# Patient Record
Sex: Female | Born: 1965 | Race: White | Hispanic: No | Marital: Married | Smoking: Former smoker
Health system: Southern US, Community
[De-identification: ages and names within clinical notes are randomized; demographics above are authoritative.]

## PROBLEM LIST (undated history)

## (undated) DIAGNOSIS — I10 Essential (primary) hypertension: Secondary | ICD-10-CM

## (undated) DIAGNOSIS — I219 Acute myocardial infarction, unspecified: Secondary | ICD-10-CM

## (undated) HISTORY — PX: CORONARY STENT PLACEMENT: SHX6853

---

## 2020-03-21 ENCOUNTER — Other Ambulatory Visit: Payer: Self-pay

## 2020-03-21 ENCOUNTER — Emergency Department (INDEPENDENT_AMBULATORY_CARE_PROVIDER_SITE_OTHER): Payer: 59

## 2020-03-21 ENCOUNTER — Emergency Department
Admission: EM | Admit: 2020-03-21 | Discharge: 2020-03-21 | Disposition: A | Discharge status: Home / Self Care | Payer: 59 | Source: Home / Self Care

## 2020-03-21 DIAGNOSIS — S92355A Nondisplaced fracture of fifth metatarsal bone, left foot, initial encounter for closed fracture: Secondary | ICD-10-CM

## 2020-03-21 HISTORY — DX: Essential (primary) hypertension: I10

## 2020-03-21 HISTORY — DX: Acute myocardial infarction, unspecified: I21.9

## 2020-03-21 NOTE — Discharge Instructions (Signed)
  Please use the boot and do not put weight on your Left foot until you follow up with Sports Medicine later this week or early next week for further evaluation and ongoing treatment of your broken foot. You may take the boot off to bath and apply a cool compress 2-3 times daily the first few days to help with swelling.  You may take Tylenol and Motrin as needed for pain and swelling.

## 2020-03-21 NOTE — ED Triage Notes (Signed)
Patient presents to Urgent Care with complaints of left foot pain since twisting her foot yesterday while walking in heels. Patient reports she heard a pop and now there is discoloration on the lateral aspect of her left foot, swelling noted as well. Pt unable to bear weight.

## 2020-03-21 NOTE — ED Provider Notes (Signed)
[facility]    CSN: [id number] Arrival date & time: 03/21/20  1017      History   Chief Complaint Chief Complaint  Patient presents with  . Foot Injury    HPI Caroline Brown is a 54 y.o. female.   HPI Caroline Brown is a 54 y.o. female presenting to UC with c/o sudden onset Left lateral foot pain yesterday. Pain is throbbing, 7/10, unable to bear weight on her foot. Pt twisted her foot wearing heels while walking on a flat surface yesterday. She heard a loud pop just prior to onset of pain.  She has noticed bruising nad swelling to the side of her foot where the pain is.  She has been using a walker she had at home from a prior owner. She does not use a walker normally.  No prior fracture or surgery to same foot. No other injuries.    Past Medical History:  Diagnosis Date  . Hypertension   . Myocardial infarction (HCC)    Six MIs per pt    There are no problems to display for this patient.   Past Surgical History:  Procedure Laterality Date  . CORONARY STENT PLACEMENT      OB History   No obstetric history on file.      Home Medications    Prior to Admission medications   Medication Sig Start Date End Date Taking? Authorizing Provider  ezetimibe (ZETIA) 10 MG tablet Take 10 mg by mouth daily.   Yes [provider]  losartan (COZAAR) 50 MG tablet Take 50 mg by mouth daily.   Yes [provider]  metoprolol tartrate (LOPRESSOR) 25 MG tablet Take 25 mg by mouth 2 (two) times daily.   Yes [provider]  omeprazole (PRILOSEC) 20 MG capsule Take 20 mg by mouth daily.   Yes [provider]    Family History Family History  Problem Relation Age of Onset  . Hypertension Mother   . Hypertension Father   . Cancer Father     Social History Social History   Tobacco Use  . Smoking status: Former Smoker    Types: Cigarettes    Quit date: 2006    Years since quitting: 15.3  . Smokeless tobacco: Never Used    Substance Use Topics  . Alcohol use: Not Currently  . Drug use: Not on file     Allergies   Codeine   Review of Systems Review of Systems  Musculoskeletal: Positive for arthralgias, gait problem and joint swelling.  Skin: Positive for color change. Negative for wound.  Neurological: Negative for weakness and numbness.     Physical Exam Triage Vital Signs ED Triage Vitals  Enc Vitals Group     BP 03/21/20 1036 116/65     Pulse Rate 03/21/20 1036 84     Resp 03/21/20 1036 16     Temp 03/21/20 1036 98.6 F (37 C)     Temp Source 03/21/20 1036 Oral     SpO2 03/21/20 1036 99 %     Weight --      Height --      Head Circumference --      Peak Flow --      Pain Score 03/21/20 1031 7     Pain Loc --      Pain Edu? --      Excl. in GC? --    No data found.  Updated Vital Signs BP 116/65 (BP Location: Right Arm)  Pulse 84   Temp 98.6 F (37 C) (Oral)   Resp 16   SpO2 99%   Visual Acuity Right Eye Distance:   Left Eye Distance:   Bilateral Distance:    Right Eye Near:   Left Eye Near:    Bilateral Near:     Physical Exam Vitals and nursing note reviewed.  Constitutional:      Appearance: Normal appearance. She is well-developed.  HENT:     Head: Normocephalic and atraumatic.  Cardiovascular:     Rate and Rhythm: Normal rate and regular rhythm.     Pulses:          Dorsalis pedis pulses are 2+ on the left side.       Posterior tibial pulses are 2+ on the left side.  Pulmonary:     Effort: Pulmonary effort is normal.  Musculoskeletal:        General: Swelling and tenderness present. Normal range of motion.     Cervical back: Normal range of motion.     Comments: Left foot: moderate edema compared to Right foot. Tenderness along 5th metatarsal. Full ROM ankle and toes.   Skin:    General: Skin is warm and dry.     Findings: Bruising present.     Comments: Left foot: mild ecchymosis to lateral aspect. Skin in tact  Neurological:     Mental Status:  She is alert and oriented to person, place, and time.     Sensory: No sensory deficit.  Psychiatric:        Behavior: Behavior normal.      UC Treatments / Results  Labs (all labs ordered are listed, but only abnormal results are displayed) Labs Reviewed - No data to display  EKG   Radiology DG Foot Complete Left  Result Date: 03/21/2020 CLINICAL DATA:  Foot injury with pain. Pain in the fifth metatarsal region. EXAM: LEFT FOOT - COMPLETE 3+ VIEW COMPARISON:  No comparison studies available. FINDINGS: Transverse nondisplaced fracture identified at the base of the fifth metatarsal. No other evidence for an acute fracture. No subluxation or dislocation. No worrisome lytic or sclerotic osseous abnormality. IMPRESSION: Nondisplaced transverse fracture at the base of the fifth metatarsal. Electronically Signed   By: Misty Stanley M.D.   On: 03/21/2020 10:56    Procedures Procedures (including critical care time)  Medications Ordered in UC Medications - No data to display  Initial Impression / Assessment and Plan / UC Course  I have reviewed the triage vital signs and the nursing notes.  Pertinent labs & imaging results that were available during my care of the patient were reviewed by me and considered in my medical decision making (see chart for details).     Imaging discussed with pt Pt placed in walking boot, may continue to use her walker as it has a seat area she can put her knee to use as a knee scooter.  F/u with Sports Medicine later this week or early next week AVS provided Final Clinical Impressions(s) / UC Diagnoses   Final diagnoses:  Closed nondisplaced fracture of fifth metatarsal bone of left foot, initial encounter     Discharge Instructions      Please use the boot and do not put weight on your Left foot until you follow up with Sports Medicine later this week or early next week for further evaluation and ongoing treatment of your broken foot. You may  take the boot off to bath and apply a cool compress  2-3 times daily the first few days to help with swelling.  You may take Tylenol and Motrin as needed for pain and swelling.    ED Prescriptions    None     PDMP not reviewed this encounter.   Lurene Shadow, [place] 03/21/20 1314

## 2020-03-25 ENCOUNTER — Ambulatory Visit: Payer: 59 | Admitting: Sports Medicine

## 2020-03-25 ENCOUNTER — Other Ambulatory Visit: Payer: Self-pay

## 2020-03-25 ENCOUNTER — Encounter: Payer: Self-pay | Admitting: Sports Medicine

## 2020-03-25 DIAGNOSIS — S92352A Displaced fracture of fifth metatarsal bone, left foot, initial encounter for closed fracture: Secondary | ICD-10-CM | POA: Diagnosis not present

## 2020-03-25 NOTE — Progress Notes (Signed)
    Procedures performed today:    None.  Independent interpretation of notes and tests performed by another provider:   X-rays personally reviewed and show a transverse fracture through the base of the fifth metatarsal, looks to be in zone 1 but bordering zone 2.  Brief History, Exam, Impression, and Recommendations:    Closed fracture of base of fifth metatarsal bone of left foot Caroline Brown is a very pleasant 54 year old female, she does have a complex medical history including several hypercoagulable states including factor V Leyden deficiency, lupus anticoagulant. More recently she took a misstep and felt a pop in her left foot, immediately she had pain and swelling, inability to bear weight, she was seen in urgent care where x-rays showed a fracture in zone one of the fifth metatarsal, but close to the zone 2 territory. I explained to her the difference between the 2 types of fractures, and the treatment plan which will be strict nonweightbearing, rolling knee scooter. I had like to see her back in 2 weeks for repeat x-rays, if less tenderness and signs of healing on the x-ray we will consider partial weightbearing with a single crutch. She declines any pain medication.    ___________________________________________ Ihor Austin. Benjamin Stain, M.D., ABFM., CAQSM. Primary Care and Sports Medicine [facility]  Adjunct Instructor of Family Medicine  [employer]

## 2020-03-25 NOTE — Assessment & Plan Note (Signed)
Caroline Brown is a very pleasant 54 year old female, she does have a complex medical history including several hypercoagulable states including factor V Leyden deficiency, lupus anticoagulant. More recently she took a misstep and felt a pop in her left foot, immediately she had pain and swelling, inability to bear weight, she was seen in urgent care where x-rays showed a fracture in zone one of the fifth metatarsal, but close to the zone 2 territory. I explained to her the difference between the 2 types of fractures, and the treatment plan which will be strict nonweightbearing, rolling knee scooter. I had like to see her back in 2 weeks for repeat x-rays, if less tenderness and signs of healing on the x-ray we will consider partial weightbearing with a single crutch. She declines any pain medication.

## 2020-04-08 ENCOUNTER — Encounter: Payer: Self-pay | Admitting: Sports Medicine

## 2020-04-08 ENCOUNTER — Other Ambulatory Visit: Payer: Self-pay

## 2020-04-08 ENCOUNTER — Ambulatory Visit (INDEPENDENT_AMBULATORY_CARE_PROVIDER_SITE_OTHER): Payer: 59 | Admitting: Sports Medicine

## 2020-04-08 ENCOUNTER — Ambulatory Visit (INDEPENDENT_AMBULATORY_CARE_PROVIDER_SITE_OTHER): Payer: 59

## 2020-04-08 DIAGNOSIS — S92352A Displaced fracture of fifth metatarsal bone, left foot, initial encounter for closed fracture: Secondary | ICD-10-CM

## 2020-04-08 NOTE — Assessment & Plan Note (Signed)
Caroline Brown returns, she is a pleasant 54 year old female, 2 weeks ago she had an inversion injury, she ended up with a fracture at the border of the first and second zones of the fifth metatarsal. She returns today, clinically she is a little better, still has significant pain on palpation. X-rays personally reviewed and do show some bony resorption around the fracture but no angulation or displacement. Continue boot with partial weightbearing for at least another 4 weeks, this will take her to 6 weeks out, x-ray before visit.

## 2020-04-08 NOTE — Addendum Note (Signed)
Addended by: Monica Becton on: 04/08/2020 03:02 PM   Modules accepted: Orders

## 2020-04-08 NOTE — Progress Notes (Signed)
    Procedures performed today:    None.  Independent interpretation of notes and tests performed by another provider:   X-rays personally reviewed and do show some bony resorption around the fracture but no angulation or displacement.  Brief History, Exam, Impression, and Recommendations:    Closed fracture of base of fifth metatarsal bone of left foot Caroline Brown returns, she is a pleasant 54 year old female, 2 weeks ago she had an inversion injury, she ended up with a fracture at the border of the first and second zones of the fifth metatarsal. She returns today, clinically she is a little better, still has significant pain on palpation. X-rays personally reviewed and do show some bony resorption around the fracture but no angulation or displacement. Continue boot with partial weightbearing for at least another 4 weeks, this will take her to 6 weeks out, x-ray before visit.    ___________________________________________ Caroline Brown. Caroline Brown, M.D., ABFM., CAQSM. Primary Care and Sports Medicine [facility]  Adjunct Instructor of Family Medicine  [employer]

## 2020-05-06 ENCOUNTER — Other Ambulatory Visit: Payer: Self-pay

## 2020-05-06 ENCOUNTER — Ambulatory Visit: Payer: 59 | Admitting: Sports Medicine

## 2020-05-06 ENCOUNTER — Ambulatory Visit (INDEPENDENT_AMBULATORY_CARE_PROVIDER_SITE_OTHER): Payer: 59

## 2020-05-06 DIAGNOSIS — S92352A Displaced fracture of fifth metatarsal bone, left foot, initial encounter for closed fracture: Secondary | ICD-10-CM | POA: Diagnosis not present

## 2020-05-06 NOTE — Assessment & Plan Note (Signed)
This is a very pleasant 54 year old female with a fifth metatarsal fracture, 4 weeks out, fracture is at the border of zone 1 and zone 2. Clinically she is doing very well, no tenderness over the fracture, she does have a little bit of filling in of the fracture line itself, I really do think this does represent a Jones fracture, we will keep her in the boot for 1 more month, get rid of the scooter, weightbearing as tolerated, x-ray before visit.

## 2020-05-06 NOTE — Progress Notes (Signed)
    Procedures performed today:    None.  Independent interpretation of notes and tests performed by another provider:   X-rays personally reviewed, there is some filling in of the fracture line.  Brief History, Exam, Impression, and Recommendations:    Closed fracture of base of fifth metatarsal bone of left foot This is a very pleasant 54 year old female with a fifth metatarsal fracture, 4 weeks out, fracture is at the border of zone 1 and zone 2. Clinically she is doing very well, no tenderness over the fracture, she does have a little bit of filling in of the fracture line itself, I really do think this does represent a Jones fracture, we will keep her in the boot for 1 more month, get rid of the scooter, weightbearing as tolerated, x-ray before visit.    ___________________________________________ Ihor Austin. Benjamin Stain, M.D., ABFM., CAQSM. Primary Care and Sports Medicine [facility]  Adjunct Instructor of Family Medicine  [employer]

## 2020-06-03 ENCOUNTER — Other Ambulatory Visit: Payer: Self-pay

## 2020-06-03 ENCOUNTER — Ambulatory Visit (INDEPENDENT_AMBULATORY_CARE_PROVIDER_SITE_OTHER): Payer: 59

## 2020-06-03 ENCOUNTER — Ambulatory Visit: Payer: 59 | Admitting: Sports Medicine

## 2020-06-03 DIAGNOSIS — S92352A Displaced fracture of fifth metatarsal bone, left foot, initial encounter for closed fracture: Secondary | ICD-10-CM

## 2020-06-03 NOTE — Assessment & Plan Note (Signed)
This is a very pleasant 54 year old female, approximately 2 months ago she sustained 1/5 metatarsal fracture, this has taken some time to heal, I really think this was a Jones fracture, today she is completely pain-free, she is eager to get rid of the boot. I think she is healed, return as needed, she should wear supportive shoes.

## 2020-06-03 NOTE — Progress Notes (Signed)
    Procedures performed today:    None.  Independent interpretation of notes and tests performed by another provider:   X-rays personally reviewed, there is good filling in of the medial aspect of the fracture line.  Brief History, Exam, Impression, and Recommendations:    Closed fracture of base of fifth metatarsal bone of left foot This is a very pleasant 54 year old female, approximately 2 months ago she sustained 1/5 metatarsal fracture, this has taken some time to heal, I really think this was a Jones fracture, today she is completely pain-free, she is eager to get rid of the boot. I think she is healed, return as needed, she should wear supportive shoes.    ___________________________________________ Ihor Austin. Benjamin Stain, M.D., ABFM., CAQSM. Primary Care and Sports Medicine [facility]  Adjunct Instructor of Family Medicine  [employer]

## 2021-05-18 ENCOUNTER — Ambulatory Visit (INDEPENDENT_AMBULATORY_CARE_PROVIDER_SITE_OTHER): Payer: No Typology Code available for payment source

## 2021-05-18 ENCOUNTER — Ambulatory Visit: Payer: No Typology Code available for payment source | Admitting: Sports Medicine

## 2021-05-18 ENCOUNTER — Other Ambulatory Visit: Payer: Self-pay

## 2021-05-18 DIAGNOSIS — M76822 Posterior tibial tendinitis, left leg: Secondary | ICD-10-CM | POA: Diagnosis not present

## 2021-05-18 DIAGNOSIS — S92352A Displaced fracture of fifth metatarsal bone, left foot, initial encounter for closed fracture: Secondary | ICD-10-CM

## 2021-05-18 MED ORDER — MELOXICAM 15 MG PO TABS: ORAL_TABLET | ORAL | 3 refills | Status: DC

## 2021-05-18 NOTE — Progress Notes (Signed)
    Procedures performed today:    None.  Independent interpretation of notes and tests performed by another provider:   None.  Brief History, Exam, Impression, and Recommendations:    Tibialis posterior tendinitis, left Caroline Brown is a pleasant 55 year old female, for several weeks she has had pain behind her medial malleolus of the left foot, radiating from the tibia down to the navicular. On exam she has swelling at the tibialis posterior, reproduction of pain with palpation on the tibialis posterior, navicular is nontender, she has pes cavus. I like her to get some custom orthotics, wear athletic shoes, adding meloxicam, x-rays, formal physical therapy for the tibialis posterior, return to see me in a month, injection if no better.  Closed fracture of base of fifth metatarsal bone of left foot We treated Caroline Brown for a fifth metatarsal base fracture, she is overall doing okay, I would like some updated x-rays as she did complain of a bit of tenderness to palpation.    ___________________________________________ Ihor Austin. Benjamin Stain, M.D., ABFM., CAQSM. Primary Care and Sports Medicine [facility]  Adjunct Instructor of Family Medicine  [employer]

## 2021-05-18 NOTE — Assessment & Plan Note (Signed)
We treated Caroline Brown for a fifth metatarsal base fracture, she is overall doing okay, I would like some updated x-rays as she did complain of a bit of tenderness to palpation.

## 2021-05-18 NOTE — Assessment & Plan Note (Signed)
Canyon is a pleasant 55 year old female, for several weeks she has had pain behind her medial malleolus of the left foot, radiating from the tibia down to the navicular. On exam she has swelling at the tibialis posterior, reproduction of pain with palpation on the tibialis posterior, navicular is nontender, she has pes cavus. I like her to get some custom orthotics, wear athletic shoes, adding meloxicam, x-rays, formal physical therapy for the tibialis posterior, return to see me in a month, injection if no better.

## 2021-05-24 ENCOUNTER — Other Ambulatory Visit: Payer: Self-pay

## 2021-05-24 ENCOUNTER — Encounter: Payer: Self-pay | Admitting: Physical Therapy

## 2021-05-24 ENCOUNTER — Ambulatory Visit: Payer: No Typology Code available for payment source | Admitting: Physical Therapy

## 2021-05-24 DIAGNOSIS — M25572 Pain in left ankle and joints of left foot: Secondary | ICD-10-CM | POA: Diagnosis not present

## 2021-05-24 DIAGNOSIS — R29898 Other symptoms and signs involving the musculoskeletal system: Secondary | ICD-10-CM

## 2021-05-24 NOTE — Patient Instructions (Signed)
Access Code: [email or web address] URL: [email or web address] Date: 05/24/2021 Prepared by: Reggy Eye  Exercises Gastroc Stretch on Wall - 1 x daily - 7 x weekly - 3 sets - 1 reps - 30 sec hold Soleus Stretch on Wall - 1 x daily - 7 x weekly - 3 sets - 1 reps - 30 sec hold

## 2021-05-24 NOTE — Therapy (Addendum)
[facility] [place], [place], [address] Phone: [phone]   Fax:  [phone]  Physical Therapy Evaluation  Patient Details  Name: Caroline Brown MRN: [id number] Date of Birth: 03-Sep-1966 Referring Provider (PT): thekkekandam   Encounter Date: 05/24/2021   PT End of Session - 05/24/21 1235     Visit Number 1    Number of Visits 6    Date for PT Re-Evaluation 07/05/21    PT Start Time 8016    PT Stop Time 1227    PT Time Calculation (min) 39 min    Activity Tolerance Patient tolerated treatment well    Behavior During Therapy [facility] for tasks assessed/performed             Past Medical History:  Diagnosis Date   Hypertension    Myocardial infarction ([place])    Six MIs per pt    Past Surgical History:  Procedure Laterality Date   CORONARY STENT PLACEMENT      There were no vitals filed for this visit.    Subjective Assessment - 05/24/21 1149     Subjective Pt has been having Lt ankle pain shooting to mid foot since 03/2020 when she had a broken 5th metatarsal. MD recommended pain meds and physical therapy as well as a brace which has not yet been ordered. Pain does not change based on activity or rest, not relieved with meds thus far.    Pertinent History Lt 5th metatarsal fracture 2021    Diagnostic tests x ray shows 5th metatarsal fracture has healed    Patient Stated Goals reduce pain    Currently in Pain? Yes    Pain Score 2     Pain Location Foot    Pain Orientation Left    Pain Descriptors / Indicators Aching    Pain Type Chronic pain    Pain Onset More than a month ago    Pain Frequency Constant    Aggravating Factors  none reported    Pain Relieving Factors none reported                [facility] PT Assessment - 05/24/21 0001       Assessment   Medical Diagnosis left tibialis posterior tendonitis    Referring Provider (PT) [email or web address]      Restrictions   Other  Position/Activity Restrictions WBAT      Balance Screen   Has the patient fallen in the past 6 months No      Prior Function   Level of Independence Independent      Observation/Other Assessments   Focus on Therapeutic Outcomes (FOTO)  72      Functional Tests   Functional tests Single leg stance      Single Leg Stance   Comments >30 sec bilat      ROM / Strength   AROM / PROM / Strength AROM;Strength      AROM   AROM Assessment Site Ankle    Right/Left Ankle Right;Left    Right Ankle Dorsiflexion 12    Right Ankle Plantar Flexion 40    Right Ankle Inversion 30    Right Ankle Eversion 15    Left Ankle Dorsiflexion 14    Left Ankle Plantar Flexion 42    Left Ankle Inversion 15    Left Ankle Eversion 22      Strength   Strength Assessment Site Ankle    Right/Left Ankle Left    Left Ankle Dorsiflexion  5/5    Left Ankle Plantar Flexion 5/5    Left Ankle Inversion 5/5    Left Ankle Eversion 5/5      Palpation   Palpation comment TTP Lt soleus, achiles, midfoot, posterior tib                        Objective measurements completed on examination: See above findings.       [facility] PT Treatment/Exercise - 05/24/21 0001       Exercises   Exercises Ankle      Modalities   Modalities Iontophoresis      Iontophoresis   Type of Iontophoresis Dexamethasone    Location Lt ankle    Dose 1.0cc    Time 4 hours      Ankle Exercises: Stretches   Soleus Stretch 2 reps;20 seconds    Gastroc Stretch 2 reps;20 seconds                    PT Education - 05/24/21 1234     Education Details significant time spent educating pt on importance of proper footwear to reduce pain. PT POC and goals, HEP    Person(s) Educated Patient    Methods Explanation;Demonstration    Comprehension Verbalized understanding;Returned demonstration                 PT Long Term Goals - 05/24/21 1239       PT LONG TERM GOAL #1   Title Pt will be  independent with HEP    Time 6    Period Weeks    Status New    Target Date 07/05/21      PT LONG TERM GOAL #2   Title Pt will report pain <3/10 on average for 5/7 days.    Time 6    Period Weeks    Status New    Target Date 07/05/21      PT LONG TERM GOAL #3   Title Pt will improve FOTO to >= 75 to demo improved functional mobility    Time 6    Period Weeks    Status New    Target Date 07/05/21                    Plan - 05/24/21 1235     Clinical Impression Statement Pt is a 55 y/o female who is referred for Lt foot and ankle pain. Pt presents with decreased ankle ROM, decreased activity tolerance and increased pain and will benefit from skilled PT to address deficits and improve functional mobility.    Personal Factors and Comorbidities Behavior Pattern;Fitness;Comorbidity 3+    Examination-Participation Restrictions Community Activity    Stability/Clinical Decision Making Stable/Uncomplicated    Clinical Decision Making Low    Rehab Potential Good    PT Frequency 1x / week    PT Duration 6 weeks    PT Treatment/Interventions Iontophoresis 29m/ml Dexamethasone;Cryotherapy;Electrical Stimulation;Moist Heat;Ultrasound;Taping;Dry needling;Manual techniques;Patient/family education;Therapeutic exercise;Balance training;Neuromuscular re-education;Therapeutic activities;Vasopneumatic Device    PT Next Visit Plan assess response to ionto, progress HEP    PT Home Exercise Plan [id number]            Patient will benefit from skilled therapeutic intervention in order to improve the following deficits and impairments:  Pain, Decreased activity tolerance, Decreased range of motion  Visit Diagnosis: Pain in left ankle and joints of left foot - Plan: PT plan of care cert/re-cert  Other symptoms and signs  involving the musculoskeletal system - Plan: PT plan of care cert/re-cert     Problem List Patient Active Problem List   Diagnosis Date Noted   Tibialis posterior  tendinitis, left 05/18/2021   Closed fracture of base of fifth metatarsal bone of left foot 03/25/2020  PHYSICAL THERAPY DISCHARGE SUMMARY  Visits from Start of Care: 1  Current functional level related to goals / functional outcomes: No progress due to only attending evaluation   Remaining deficits: See above   Education / Equipment: HEP   Patient agrees to discharge. Patient goals were not met. Patient is being discharged due to not returning since the last visit. Isabelle Course, PT,DPT08/12/223:04 PM   Isabelle Course, [place] 05/24/2021, 12:42 PM  [facility] [address] [place], [place], [address] Phone: [phone]   Fax:  [phone]  Name: Caroline Brown MRN: [id number] Date of Birth: 1966-06-26

## 2021-06-19 ENCOUNTER — Ambulatory Visit: Payer: No Typology Code available for payment source | Admitting: Sports Medicine

## 2021-06-30 ENCOUNTER — Other Ambulatory Visit: Payer: Self-pay | Admitting: *Deleted

## 2021-06-30 DIAGNOSIS — M76822 Posterior tibial tendinitis, left leg: Secondary | ICD-10-CM

## 2021-06-30 MED ORDER — MELOXICAM 15 MG PO TABS: 15.0000 mg | ORAL_TABLET | Freq: Every day | ORAL | 3 refills | Status: DC

## 2021-07-10 ENCOUNTER — Encounter: Payer: Self-pay | Admitting: Family Medicine

## 2021-07-10 ENCOUNTER — Other Ambulatory Visit: Payer: Self-pay

## 2021-07-10 ENCOUNTER — Ambulatory Visit: Payer: No Typology Code available for payment source | Admitting: Family Medicine

## 2021-07-10 DIAGNOSIS — M76822 Posterior tibial tendinitis, left leg: Secondary | ICD-10-CM | POA: Diagnosis not present

## 2021-07-10 NOTE — Assessment & Plan Note (Signed)
Having some instability leading to her recurrent pain. -Counseled on home exercise therapy and supportive care. -Orthotics.

## 2021-07-10 NOTE — Progress Notes (Signed)
  Caroline Brown - 55 y.o. female MRN [id number]  Date of birth: 03-20-1966  SUBJECTIVE:  Including CC & ROS.  No chief complaint on file.   Caroline Brown is a 55 y.o. female that is presenting with left foot pain.  Having some posterior tibialis tendinitis  causing her pain.   Review of Systems See HPI   HISTORY: Past Medical, Surgical, Social, and Family History Reviewed & Updated per EMR.   Pertinent Historical Findings include:  Past Medical History:  Diagnosis Date   Hypertension    Myocardial infarction (HCC)    Six MIs per pt    Past Surgical History:  Procedure Laterality Date   CORONARY STENT PLACEMENT      Family History  Problem Relation Age of Onset   Hypertension Mother    Hypertension Father    Cancer Father     Social History   Socioeconomic History   Marital status: Married    Spouse name: Not on file   Number of children: Not on file   Years of education: Not on file   Highest education level: Not on file  Occupational History   Not on file  Tobacco Use   Smoking status: Former    Types: Cigarettes    Quit date: 2006    Years since quitting: 16.6   Smokeless tobacco: Never  Substance and Sexual Activity   Alcohol use: Not Currently   Drug use: Not on file   Sexual activity: Not on file  Other Topics Concern   Not on file  Social History Narrative   Not on file   Social Determinants of Health   Financial Resource Strain: Not on file  Food Insecurity: Not on file  Transportation Needs: Not on file  Physical Activity: Not on file  Stress: Not on file  Social Connections: Not on file  Intimate Partner Violence: Not on file     PHYSICAL EXAM:  VS: Ht 5\' 2"  (1.575 m)   Wt 162 lb (73.5 kg)   BMI 29.63 kg/m  Physical Exam Gen: NAD, alert, cooperative with exam, well-appearing   Patient was fitted for a standard, cushioned, semi-rigid orthotic. The orthotic was heated and afterward the patient stood on the orthotic blank positioned  on the orthotic stand. The patient was positioned in subtalar neutral position and 10 degrees of ankle dorsiflexion in a weight bearing stance. After completion of molding, a stable base was applied to the orthotic blank. The blank was ground to a stable position for weight bearing. Size: 81m Pairs: 2 Base: Blue EVA Additional Posting and Padding: None The patient ambulated these, and they were very comfortable.   ASSESSMENT & PLAN:   Tibialis posterior tendinitis, left Having some instability leading to her recurrent pain. -Counseled on home exercise therapy and supportive care. -Orthotics.

## 2021-08-08 ENCOUNTER — Other Ambulatory Visit: Payer: Self-pay | Admitting: *Deleted

## 2021-08-08 DIAGNOSIS — I728 Aneurysm of other specified arteries: Secondary | ICD-10-CM

## 2021-08-08 DIAGNOSIS — R1013 Epigastric pain: Secondary | ICD-10-CM

## 2021-08-11 ENCOUNTER — Other Ambulatory Visit: Payer: Self-pay

## 2021-08-11 ENCOUNTER — Ambulatory Visit
Admission: RE | Admit: 2021-08-11 | Discharge: 2021-08-11 | Disposition: A | Discharge status: Home / Self Care | Payer: No Typology Code available for payment source | Source: Ambulatory Visit | Attending: *Deleted | Admitting: *Deleted

## 2021-08-11 DIAGNOSIS — R1013 Epigastric pain: Secondary | ICD-10-CM

## 2021-08-11 DIAGNOSIS — I728 Aneurysm of other specified arteries: Secondary | ICD-10-CM

## 2021-08-11 MED ORDER — IOPAMIDOL (ISOVUE-370) INJECTION 76%
75.0000 mL | Freq: Once | INTRAVENOUS | Status: AC | PRN
  Administered 2021-08-11: 75 mL via INTRAVENOUS

## 2021-08-23 ENCOUNTER — Other Ambulatory Visit: Payer: No Typology Code available for payment source

## 2021-11-06 ENCOUNTER — Ambulatory Visit: Payer: No Typology Code available for payment source | Admitting: Sports Medicine

## 2021-11-06 ENCOUNTER — Other Ambulatory Visit: Payer: Self-pay

## 2021-11-06 ENCOUNTER — Ambulatory Visit (INDEPENDENT_AMBULATORY_CARE_PROVIDER_SITE_OTHER): Payer: No Typology Code available for payment source

## 2021-11-06 DIAGNOSIS — M79671 Pain in right foot: Secondary | ICD-10-CM | POA: Diagnosis not present

## 2021-11-06 DIAGNOSIS — G8929 Other chronic pain: Secondary | ICD-10-CM

## 2021-11-06 MED ORDER — CELECOXIB 200 MG PO CAPS: ORAL_CAPSULE | ORAL | 2 refills | Status: AC

## 2021-11-06 NOTE — Assessment & Plan Note (Signed)
Pleasant 55 year old female, for over a month she has had pain in her right dorsal midfoot, localized at the base of the third and fourth metatarsals at the articulation with the tarsals. Pain worse with active and passive plantar flexion. I think this is either osteoarthritis or a stress injury, no changes in activity or footwear. We will do a postop shoe for at least 2 to 4 weeks, adding meloxicam, x-rays. Return to see me in about 4 weeks, if insufficient improvement we will consider an MRI versus injection.

## 2021-11-06 NOTE — Progress Notes (Signed)
    Procedures performed today:    None.  Independent interpretation of notes and tests performed by another provider:   None.  Brief History, Exam, Impression, and Recommendations:    Chronic foot pain, right Pleasant 55 year old female, for over a month she has had pain in her right dorsal midfoot, localized at the base of the third and fourth metatarsals at the articulation with the tarsals. Pain worse with active and passive plantar flexion. I think this is either osteoarthritis or a stress injury, no changes in activity or footwear. We will do a postop shoe for at least 2 to 4 weeks, adding meloxicam, x-rays. Return to see me in about 4 weeks, if insufficient improvement we will consider an MRI versus injection.    ___________________________________________ Ihor Austin. Benjamin Stain, M.D., ABFM., CAQSM. Primary Care and Sports Medicine [facility]  Adjunct Instructor of Family Medicine  [employer]

## 2021-12-04 ENCOUNTER — Ambulatory Visit: Payer: No Typology Code available for payment source | Admitting: Sports Medicine

## 2021-12-04 ENCOUNTER — Other Ambulatory Visit: Payer: Self-pay

## 2021-12-04 DIAGNOSIS — G8929 Other chronic pain: Secondary | ICD-10-CM

## 2021-12-04 DIAGNOSIS — M79671 Pain in right foot: Secondary | ICD-10-CM

## 2021-12-04 NOTE — Assessment & Plan Note (Signed)
Caroline Brown returns, she is a pleasant 56 year old female, prior to the last visit she had to had over about a pain of the right dorsal midfoot localized at the base of the third and fourth metatarsals at the articulation with the tarsals. We suspected stress fracture, x-rays were unrevealing, added a postop shoe now for approximately 3-1/2 weeks, she does not report any improvement. Postop shoe is a bit loose, we also added a scaphoid pad. Return in 2 to 4 weeks, we will do an MRI if no better. If MRI confirmed stress fracture and she still has significant discomfort in spite of the postop shoe we will place her in a cast.

## 2021-12-04 NOTE — Progress Notes (Signed)
° ° °  Procedures performed today:    None.  Independent interpretation of notes and tests performed by another provider:   None.  Brief History, Exam, Impression, and Recommendations:    Chronic foot pain, right [place] returns, she is a pleasant 56 year old female, prior to the last visit she had to had over about a pain of the right dorsal midfoot localized at the base of the third and fourth metatarsals at the articulation with the tarsals. We suspected stress fracture, x-rays were unrevealing, added a postop shoe now for approximately 3-1/2 weeks, she does not report any improvement. Postop shoe is a bit loose, we also added a scaphoid pad. Return in 2 to 4 weeks, we will do an MRI if no better. If MRI confirmed stress fracture and she still has significant discomfort in spite of the postop shoe we will place her in a cast.    ___________________________________________ Gwen Her. Dianah Field, M.D., ABFM., CAQSM. Primary Care and [place] Instructor of [employer] [facility] of [employer]

## 2022-01-02 ENCOUNTER — Other Ambulatory Visit: Payer: Self-pay

## 2022-01-02 ENCOUNTER — Ambulatory Visit (INDEPENDENT_AMBULATORY_CARE_PROVIDER_SITE_OTHER): Payer: No Typology Code available for payment source | Admitting: Sports Medicine

## 2022-01-02 ENCOUNTER — Ambulatory Visit (INDEPENDENT_AMBULATORY_CARE_PROVIDER_SITE_OTHER): Payer: No Typology Code available for payment source

## 2022-01-02 DIAGNOSIS — M79671 Pain in right foot: Secondary | ICD-10-CM

## 2022-01-02 DIAGNOSIS — G8929 Other chronic pain: Secondary | ICD-10-CM

## 2022-01-02 NOTE — Assessment & Plan Note (Signed)
Pleasant 56 year old female, chronic right foot pain dorsal midfoot since early November, she has had x-rays, NSAIDs, activity modification, postop shoe for over a month without any improvement at all. For insurance purposes greater than 6 weeks of physician directed conservative treatment. Differential includes stress fracture, AVN, osteoarthritis, at this point we do need to proceed with MRI.

## 2022-01-02 NOTE — Progress Notes (Signed)
° ° °  Procedures performed today:    None.  Independent interpretation of notes and tests performed by another provider:   None.  Brief History, Exam, Impression, and Recommendations:    Chronic foot pain, right Pleasant 56 year old female, chronic right foot pain dorsal midfoot since early November, she has had x-rays, NSAIDs, activity modification, postop shoe for over a month without any improvement at all. For insurance purposes greater than 6 weeks of physician directed conservative treatment. Differential includes stress fracture, AVN, osteoarthritis, at this point we do need to proceed with MRI.    ___________________________________________ Ihor Austin. Benjamin Stain, M.D., ABFM., CAQSM. Primary Care and Sports Medicine [facility]  Adjunct Instructor of Family Medicine  [employer]

## 2022-05-23 ENCOUNTER — Ambulatory Visit: Payer: Self-pay

## 2022-11-05 IMAGING — MR MR FOOT*R* W/O CM
5 series · 40 of 40 positions shown · non-contrast
Comparison: X-ray 11/06/2021

CLINICAL DATA: Dorsal foot pain since Sunday September, 2021. Concern for
stress injury

EXAM:
MRI OF THE RIGHT FOREFOOT WITHOUT CONTRAST
TECHNIQUE: Multiplanar, multisequence MR imaging of the right foot was
performed. No intravenous contrast was administered.

[Series 5: T1 · coronal · 3.0mm · 0.53mm/px · 10 of 47 slices shown (1 of 2)]
[im 1/47]
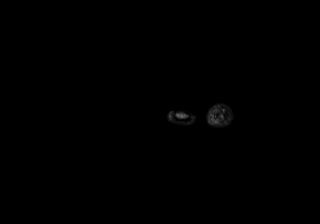
[im 6/47]
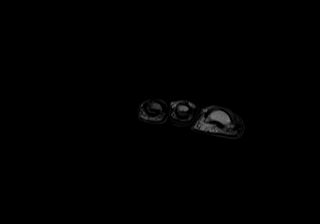
[im 11/47]
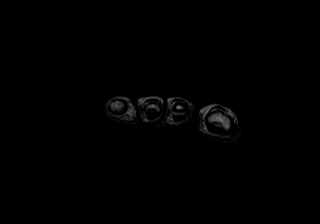
[im 16/47]
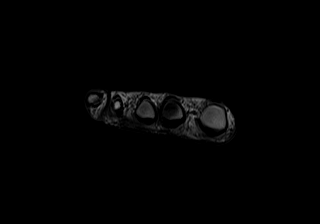
[im 21/47]
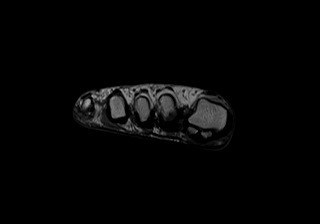
[im 26/47]
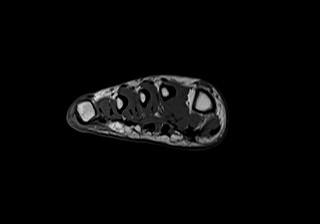
[im 31/47]
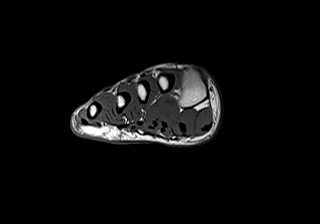
[im 36/47]
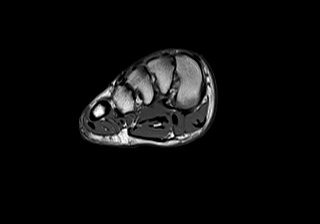
[im 41/47]
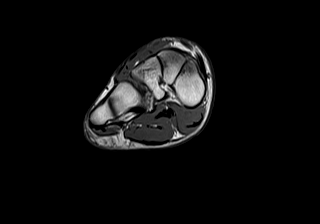
[im 47/47]
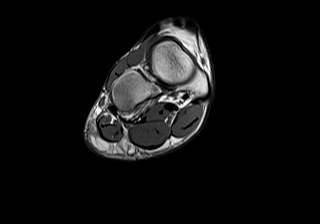

[Series 6: T2 fat-sat · coronal · 3.0mm · 0.53mm/px · 11 of 47 slices shown (1 of 2)]
[im 1/47]
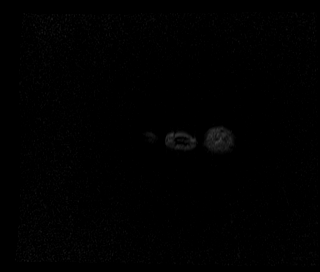
[im 5/47]
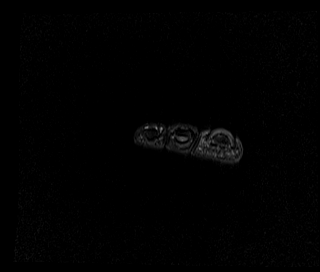
[im 10/47]
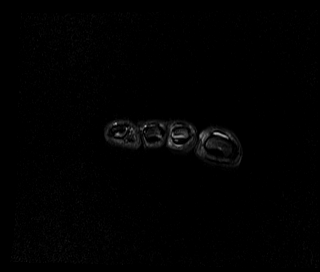
[im 14/47]
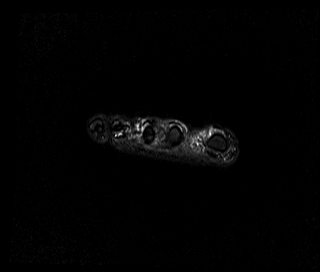
[im 19/47]
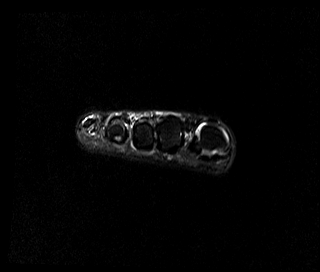
[im 24/47]
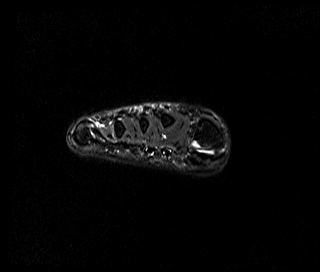
[im 28/47]
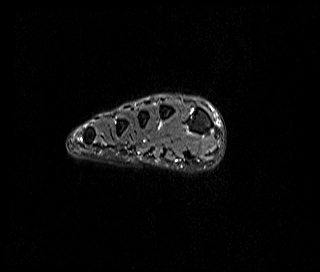
[im 33/47]
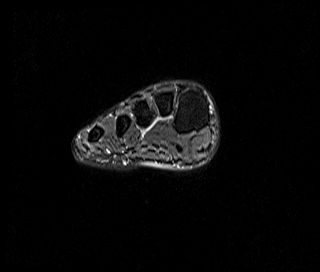
[im 37/47]
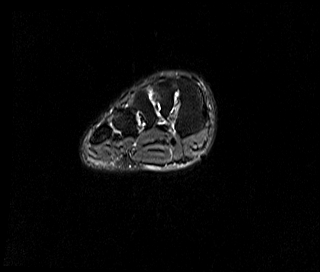
[im 42/47]
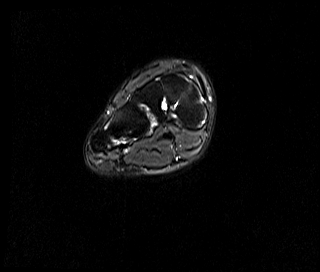
[im 47/47]
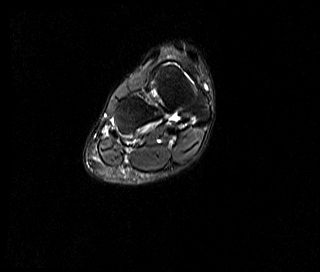

[Series 7: STIR · sagittal · 3.0mm · 0.70mm/px · 7 of 29 slices shown]
[im 1/29]
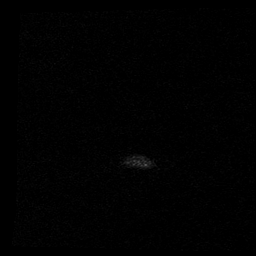
[im 5/29]
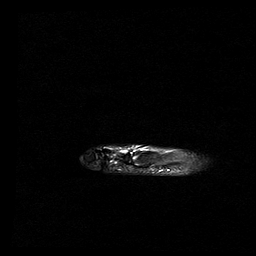
[im 10/29]
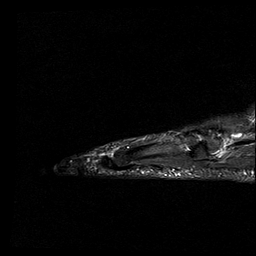
[im 15/29]
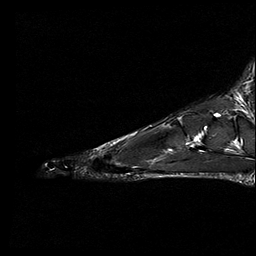
[im 19/29]
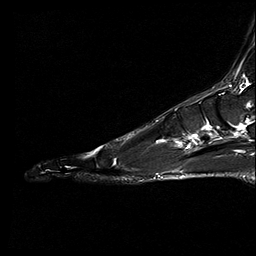
[im 24/29]
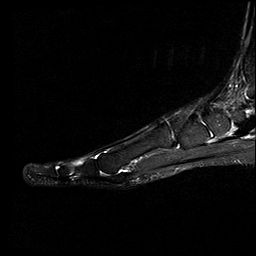
[im 29/29]
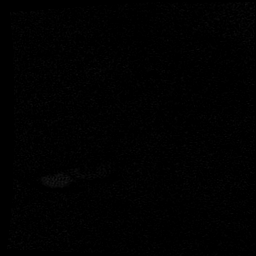

[Series 8: T1 · axial · 3.0mm · 0.62mm/px · z∈[-105,-34]mm · 6 of 24 slices shown (2 of 2)]
[im 1/24]
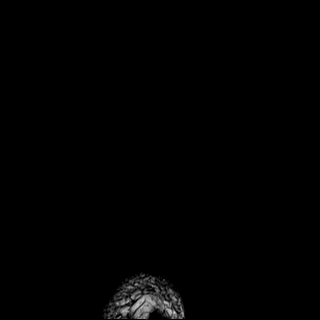
[im 5/24]
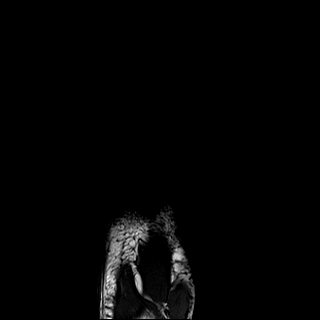
[im 10/24]
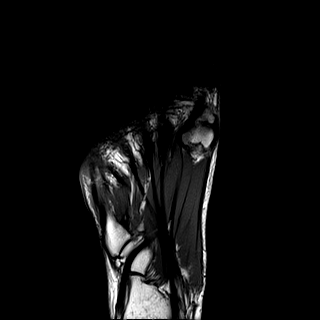
[im 14/24]
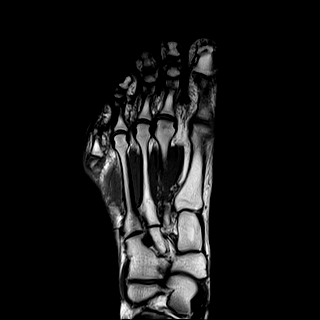
[im 19/24]
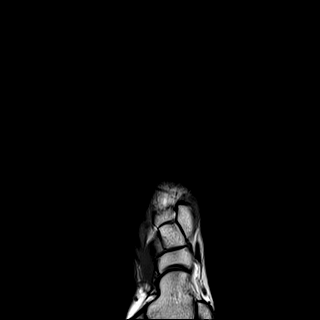
[im 24/24]
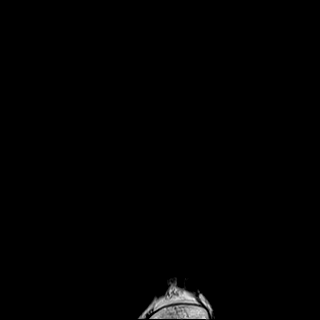

[Series 9: T2 fat-sat · axial · 3.0mm · 0.66mm/px · z∈[-97,-26]mm · 6 of 24 slices shown (2 of 2)]
[im 1/24]
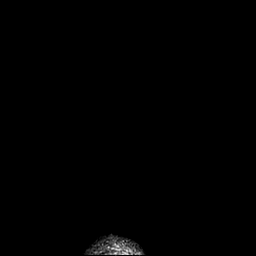
[im 5/24]
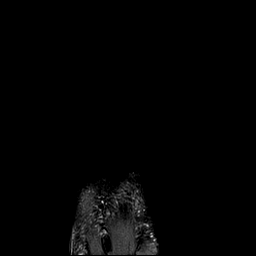
[im 10/24]
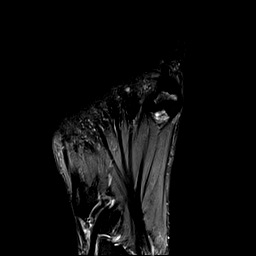
[im 14/24]
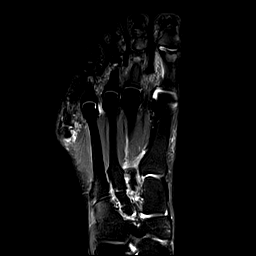
[im 19/24]
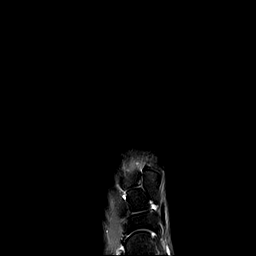
[im 24/24]
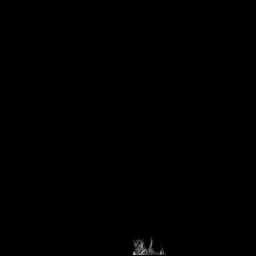

[40 of 40 positions shown; findings below may reference images not displayed]

FINDINGS: Bones/Joint/Cartilage

No acute fracture. No dislocation. No bone marrow edema or
periostitis. No significant arthropathy. No large joint effusion. No
erosion. No bone lesion.

Ligaments

Intact Lisfranc ligament. Collateral ligaments of the forefoot are
intact.

Muscles and Tendons

Intact flexor and extensor tendons without tendinosis, tear, or
tenosynovitis. Normal muscle bulk and signal intensity without
edema, atrophy, or fatty infiltration.

Soft tissues

No ulceration. Trace fluid within the first and third
intermetatarsal spaces. No solid or cystic masses identified.
IMPRESSION: 1. No acute abnormality of the right forefoot. No evidence of stress
reaction or fracture.
2. No significant arthropathy.
3. Trace fluid within the first and third intermetatarsal spaces,
which may reflect mild bursitis.

## 2022-12-05 DIAGNOSIS — E1122 Type 2 diabetes mellitus with diabetic chronic kidney disease: Secondary | ICD-10-CM | POA: Diagnosis not present

## 2022-12-05 DIAGNOSIS — I129 Hypertensive chronic kidney disease with stage 1 through stage 4 chronic kidney disease, or unspecified chronic kidney disease: Secondary | ICD-10-CM | POA: Diagnosis not present

## 2022-12-05 DIAGNOSIS — T466X5A Adverse effect of antihyperlipidemic and antiarteriosclerotic drugs, initial encounter: Secondary | ICD-10-CM | POA: Diagnosis not present

## 2022-12-05 DIAGNOSIS — I2510 Atherosclerotic heart disease of native coronary artery without angina pectoris: Secondary | ICD-10-CM | POA: Diagnosis not present

## 2022-12-05 DIAGNOSIS — N182 Chronic kidney disease, stage 2 (mild): Secondary | ICD-10-CM | POA: Diagnosis not present

## 2022-12-05 DIAGNOSIS — D6859 Other primary thrombophilia: Secondary | ICD-10-CM | POA: Diagnosis not present

## 2022-12-05 DIAGNOSIS — M6282 Rhabdomyolysis: Secondary | ICD-10-CM | POA: Diagnosis not present

## 2022-12-05 DIAGNOSIS — I252 Old myocardial infarction: Secondary | ICD-10-CM | POA: Diagnosis not present

## 2022-12-12 DIAGNOSIS — I728 Aneurysm of other specified arteries: Secondary | ICD-10-CM | POA: Diagnosis not present

## 2022-12-12 DIAGNOSIS — I700 Atherosclerosis of aorta: Secondary | ICD-10-CM | POA: Diagnosis not present

## 2022-12-12 DIAGNOSIS — E119 Type 2 diabetes mellitus without complications: Secondary | ICD-10-CM | POA: Diagnosis not present

## 2022-12-12 DIAGNOSIS — D6851 Activated protein C resistance: Secondary | ICD-10-CM | POA: Diagnosis not present

## 2022-12-12 DIAGNOSIS — I771 Stricture of artery: Secondary | ICD-10-CM | POA: Diagnosis not present

## 2022-12-12 DIAGNOSIS — I10 Essential (primary) hypertension: Secondary | ICD-10-CM | POA: Diagnosis not present

## 2022-12-12 DIAGNOSIS — I723 Aneurysm of iliac artery: Secondary | ICD-10-CM | POA: Diagnosis not present

## 2022-12-12 DIAGNOSIS — K429 Umbilical hernia without obstruction or gangrene: Secondary | ICD-10-CM | POA: Diagnosis not present

## 2023-01-02 DIAGNOSIS — D6862 Lupus anticoagulant syndrome: Secondary | ICD-10-CM | POA: Diagnosis not present

## 2023-01-02 DIAGNOSIS — R7989 Other specified abnormal findings of blood chemistry: Secondary | ICD-10-CM | POA: Diagnosis not present

## 2023-01-02 DIAGNOSIS — D6859 Other primary thrombophilia: Secondary | ICD-10-CM | POA: Diagnosis not present

## 2023-03-11 ENCOUNTER — Encounter: Payer: Self-pay | Admitting: *Deleted

## 2023-03-30 DIAGNOSIS — Z0000 Encounter for general adult medical examination without abnormal findings: Secondary | ICD-10-CM | POA: Diagnosis not present

## 2023-04-05 DIAGNOSIS — I2510 Atherosclerotic heart disease of native coronary artery without angina pectoris: Secondary | ICD-10-CM | POA: Diagnosis not present

## 2023-04-05 DIAGNOSIS — F5101 Primary insomnia: Secondary | ICD-10-CM | POA: Diagnosis not present

## 2023-04-05 DIAGNOSIS — I728 Aneurysm of other specified arteries: Secondary | ICD-10-CM | POA: Diagnosis not present

## 2023-04-05 DIAGNOSIS — M79604 Pain in right leg: Secondary | ICD-10-CM | POA: Diagnosis not present

## 2023-04-05 DIAGNOSIS — R202 Paresthesia of skin: Secondary | ICD-10-CM | POA: Diagnosis not present

## 2023-04-05 DIAGNOSIS — G8929 Other chronic pain: Secondary | ICD-10-CM | POA: Diagnosis not present

## 2023-04-05 DIAGNOSIS — E119 Type 2 diabetes mellitus without complications: Secondary | ICD-10-CM | POA: Diagnosis not present

## 2023-04-05 DIAGNOSIS — M545 Low back pain, unspecified: Secondary | ICD-10-CM | POA: Diagnosis not present

## 2023-04-05 DIAGNOSIS — D6862 Lupus anticoagulant syndrome: Secondary | ICD-10-CM | POA: Diagnosis not present

## 2023-04-05 DIAGNOSIS — I10 Essential (primary) hypertension: Secondary | ICD-10-CM | POA: Diagnosis not present

## 2023-04-05 DIAGNOSIS — Z0000 Encounter for general adult medical examination without abnormal findings: Secondary | ICD-10-CM | POA: Diagnosis not present

## 2023-04-20 DIAGNOSIS — R197 Diarrhea, unspecified: Secondary | ICD-10-CM | POA: Diagnosis not present

## 2023-04-20 DIAGNOSIS — R519 Headache, unspecified: Secondary | ICD-10-CM | POA: Diagnosis not present

## 2023-04-20 DIAGNOSIS — J322 Chronic ethmoidal sinusitis: Secondary | ICD-10-CM | POA: Diagnosis not present

## 2023-04-20 DIAGNOSIS — Z8669 Personal history of other diseases of the nervous system and sense organs: Secondary | ICD-10-CM | POA: Diagnosis not present

## 2023-04-20 DIAGNOSIS — Z886 Allergy status to analgesic agent status: Secondary | ICD-10-CM | POA: Diagnosis not present

## 2023-04-20 DIAGNOSIS — R112 Nausea with vomiting, unspecified: Secondary | ICD-10-CM | POA: Diagnosis not present

## 2023-04-20 DIAGNOSIS — Z91018 Allergy to other foods: Secondary | ICD-10-CM | POA: Diagnosis not present

## 2023-04-20 DIAGNOSIS — R300 Dysuria: Secondary | ICD-10-CM | POA: Diagnosis not present

## 2023-06-19 DIAGNOSIS — M47816 Spondylosis without myelopathy or radiculopathy, lumbar region: Secondary | ICD-10-CM | POA: Diagnosis not present

## 2023-06-19 DIAGNOSIS — M5136 Other intervertebral disc degeneration, lumbar region: Secondary | ICD-10-CM | POA: Diagnosis not present

## 2023-06-19 DIAGNOSIS — M4316 Spondylolisthesis, lumbar region: Secondary | ICD-10-CM | POA: Diagnosis not present

## 2023-07-03 DIAGNOSIS — D6862 Lupus anticoagulant syndrome: Secondary | ICD-10-CM | POA: Diagnosis not present

## 2023-07-04 ENCOUNTER — Ambulatory Visit: Payer: 59 | Admitting: Sports Medicine

## 2023-07-04 DIAGNOSIS — M47816 Spondylosis without myelopathy or radiculopathy, lumbar region: Secondary | ICD-10-CM

## 2023-07-04 MED ORDER — GABAPENTIN 300 MG PO CAPS
ORAL_CAPSULE | ORAL | 3 refills | Status: DC
Start: 2023-07-04 — End: 2024-02-19

## 2023-07-04 MED ORDER — PREDNISONE 50 MG PO TABS: ORAL_TABLET | ORAL | 0 refills | Status: DC

## 2023-07-04 MED ORDER — PREDNISONE 50 MG PO TABS: ORAL_TABLET | ORAL | 0 refills | Status: AC

## 2023-07-04 MED ORDER — GABAPENTIN 300 MG PO CAPS: ORAL_CAPSULE | ORAL | 3 refills | Status: DC

## 2023-07-04 NOTE — Progress Notes (Signed)
    Procedures performed today:    None.  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  Brief History, Exam, Impression, and Recommendations:    Lumbar spondylosis Pleasant 57 year old female, increasing low back pain, right-sided, she localizes it over the sacroiliac joint. Worse with most motions, does not wake her at night. On exam she has tenderness directly over the SI joint. Otherwise good motion, good strength. 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 We will do a 5-day course of prednisone, I will increase her gabapentin, she is currently taking 200 mg nightly, will go up to 300 mg nightly followed by 300 mg twice daily if needed. She does need to do some aggressive formal physical therapy, will do all this for 6 weeks, at the follow-up if not better we will do a SI joint injection with ultrasound guidance.    ____________________________________________ Ihor Austin. Benjamin Stain, M.D., ABFM., CAQSM., AME. Primary Care and Sports Medicine [facility]  Adjunct Professor of Family Medicine  [employer] [facility] [employer] [employer] [facility] [employer]  Restaurant manager, fast food

## 2023-07-04 NOTE — Assessment & Plan Note (Signed)
Pleasant 57 year old female, increasing low back pain, right-sided, she localizes it over the sacroiliac joint. Worse with most motions, does not wake her at night. On exam she has tenderness directly over the SI joint. Otherwise good motion, good strength. 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 We will do a 5-day course of prednisone, I will increase her gabapentin, she is currently taking 200 mg nightly, will go up to 300 mg nightly followed by 300 mg twice daily if needed. She does need to do some aggressive formal physical therapy, will do all this for 6 weeks, at the follow-up if not better we will do a SI joint injection with ultrasound guidance.

## 2023-07-04 NOTE — Addendum Note (Signed)
Addended by: Monica Becton on: 07/04/2023 09:25 AM   Modules accepted: Orders

## 2023-07-05 ENCOUNTER — Other Ambulatory Visit: Payer: Self-pay | Admitting: Sports Medicine

## 2023-07-05 DIAGNOSIS — M47816 Spondylosis without myelopathy or radiculopathy, lumbar region: Secondary | ICD-10-CM

## 2023-07-11 NOTE — Therapy (Signed)
OUTPATIENT PHYSICAL THERAPY THORACOLUMBAR EVALUATION   Patient Name: Caroline Brown MRN: [id number] DOB:08/13/1966, 57 y.o., female Today's Date: 07/16/2023  END OF SESSION:  PT End of Session - 07/16/23 0926     Visit Number 1    Number of Visits 24    Date for PT Re-Evaluation 10/08/23    PT Start Time 0800    PT Stop Time 0849    PT Time Calculation (min) 49 min    Activity Tolerance Patient tolerated treatment well             Past Medical History:  Diagnosis Date   Hypertension    Myocardial infarction (HCC)    Six MIs per pt   Past Surgical History:  Procedure Laterality Date   CORONARY STENT PLACEMENT     Patient Active Problem List   Diagnosis Date Noted   Lumbar spondylosis 07/04/2023   Chronic foot pain, right 11/06/2021   Tibialis posterior tendinitis, left 05/18/2021   Closed fracture of base of fifth metatarsal bone of left foot 03/25/2020    PCP: Clearnce Hasten, PA-C  REFERRING PROVIDER: Dr Rodney Langton   REFERRING DIAG: Lumbar spondylosis  Rationale for Evaluation and Treatment: Rehabilitation  THERAPY DIAG:  Lumbar spondylosis  Other low back pain  Muscle weakness (generalized)  ONSET DATE: 06/26/22 increased symptoms  SUBJECTIVE:                                                                                                                                                                                           SUBJECTIVE STATEMENT: Patient reports that she has constant pain in the LB. She has had pain for years. Symptoms have increased in the past year. X-rays show degenerative changes in the LB and hips R > L through the sacrum. She reports gradual onset of pain over the years.   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 Yes: NPRS scale: 4-5/10 Pain location: LB/sacral area  Pain description: burning; sharp; constant Aggravating  factors: anything; increased activity such as housework or walking; standing in line Relieving factors: heat; ice; pain patch; rest  PRECAUTIONS: None  RED FLAGS: None   WEIGHT BEARING RESTRICTIONS: No  FALLS:  Has patient fallen in last 6 months? 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; has Programme researcher, broadcasting/film/video; working on property  PLOF: Independent  PATIENT GOALS: figure out how to take some of the pain off  NEXT  MD VISIT: 08/15/23  OBJECTIVE:   DIAGNOSTIC FINDINGS:  None available for spine   PATIENT SURVEYS:  FOTO 44; goal 58  SCREENING FOR RED FLAGS: Bowel or bladder incontinence: No Spinal tumors: No Cauda equina syndrome: No Compression fracture: No Abdominal aneurysm: No  COGNITION: Overall cognitive status: Within functional limits for tasks assessed     SENSATION: WFL  MUSCLE LENGTH: Hamstrings: Right 85 deg; Left 80 deg Tight bilat hip flexors   POSTURE: rounded shoulders, forward head, and flexed trunk   PALPATION: Muscular tightness in the R > L hip flexors; piriformis; gluts Pain with spring testing through the sacrum; L5/4 area   LUMBAR ROM:   AROM eval  Flexion 90%  Extension 60%  Right lateral flexion 70% pain R side   Left lateral flexion 75% mild pain L side   Right rotation 40%  Left rotation 45%   (Blank rows = not tested)  LOWER EXTREMITY ROM:   tight hip extension bilat    LOWER EXTREMITY MMT:  weak abdominals/core    MMT Right eval Left eval  Hip flexion    Hip extension 4 4  Hip abduction    Hip adduction    Hip internal rotation    Hip external rotation    Knee flexion    Knee extension    Ankle dorsiflexion    Ankle plantarflexion    Ankle inversion    Ankle eversion     (Blank rows = not tested)  LUMBAR SPECIAL TESTS:  Straight leg raise test: Negative and Slump test: Negative  discomfort with slump test - no radicular pain   FUNCTIONAL TESTS:  5 times sit to stand:  GAIT: Distance walked: 40 Assistive device utilized: None Level of assistance: Complete Independence Comments: [facility]  [facility] Adult PT Treatment:                                                DATE: 07/16/23 Therapeutic Exercise: Prone  Press up 2-3 sec x 8 Supine  4 part core x 10 Piriformis stretch 30 sec x 2 R/L  Standing  Ball release posterior hips  Manual Therapy: Add trial of DN  Neuromuscular re-ed: Continue with postural correction  Therapeutic Activity: Sitting with noodle along spine  Modalities: Add trial of TENS  Self Care: Importance of core strength     PATIENT EDUCATION:  Education details: POC; HEP Person educated: Patient Education method: Explanation, Demonstration, Tactile cues, Verbal cues, and Handouts Education comprehension: verbalized understanding, returned demonstration, verbal cues required, tactile cues required, and needs further education  HOME EXERCISE PROGRAM: Access Code: [facility] URL: [email or web address] Date: 07/16/2023 Prepared by: Corlis Leak  Exercises - Prone Press Up  - 2 x daily - 7 x weekly - 1 sets - 10 reps - 2-3 sec  hold - Standing Lumbar Extension  - 2 x daily - 7 x weekly - 1 sets - 2-3 reps - 2-3 sec  hold - Supine Transversus Abdominis Bracing with Pelvic Floor Contraction  - 2 x daily - 7 x weekly - 1 sets - 10 reps - 10sec  hold - Supine Piriformis Stretch with Leg Straight  - 2 x daily - 7 x weekly - 1 sets - 3 reps - 30 sec  hold - Standing Piriformis Release with Ball at Wall  - 2 x daily - 7 x weekly -  30-60 sec  hold  Patient Education - Office Posture - Trigger Point Dry Needling  ASSESSMENT:  CLINICAL IMPRESSION: Patient is a 57 y.o. female who was seen today for physical therapy evaluation and treatment for lumbar spondylosis; low back pain. She presents with poor core strength and stability;  decreased trunk ROM/mobility; muscular tightness to palpation; constant pain; pain increased with functional activities. Patient will benefit from PT to address problems identified.   OBJECTIVE IMPAIRMENTS: decreased activity tolerance, decreased mobility, decreased ROM, decreased strength, increased fascial restrictions, impaired flexibility, improper body mechanics, postural dysfunction, and pain.   ACTIVITY LIMITATIONS: carrying, lifting, bending, sitting, standing, and sleeping  PARTICIPATION LIMITATIONS: meal prep, cleaning, laundry, community activity, occupation, and yard work  PERSONAL FACTORS: Age, Fitness, Past/current experiences, and Time since onset of injury/illness/exacerbation are also affecting patient's functional outcome.   REHAB POTENTIAL: Good  CLINICAL DECISION MAKING: Stable/uncomplicated  EVALUATION COMPLEXITY: Low   GOALS: Goals reviewed with patient? Yes  SHORT TERM GOALS: Target date: 08/27/2023   Independent in initial HEP  Baseline: Goal status: INITIAL  2.  Patient to demonstrate and/or verbalize improved posture and alignment for functional tasks  Baseline:  Goal status: INITIAL   LONG TERM GOALS: Target date: 10/08/2023   Decrease pain in LB and hips by 50-70% allowing patient to increase functional activity level without increased pain  Baseline:  Goal status: INITIAL  2.  Increase core strength and stability with patient to tolerate 30-40 minutes of strengthening exercises with minimal to no increase in pain  Baseline:  Goal status: INITIAL  3.  Patient reports increased tolerance for functional activities  Baseline:  Goal status: INITIAL  4.  5/5 bilat hip extension strength  Baseline:  Goal status: INITIAL  5.  Independent in HEP including aquatic exercise as indicated  Baseline:  Goal status: INITIAL  6.  Improve functional limitation score to 58  Baseline:  Goal status: INITIAL  PLAN:  PT FREQUENCY: 2x/week  PT  DURATION: 12 weeks  PLANNED INTERVENTIONS: Therapeutic exercises, Therapeutic activity, Neuromuscular re-education, Balance training, Gait training, Patient/Family education, Self Care, Joint mobilization, Aquatic Therapy, Dry Needling, Electrical stimulation, Spinal mobilization, Cryotherapy, Moist heat, Taping, Traction, Ultrasound, Manual therapy, and Re-evaluation.  PLAN FOR NEXT SESSION: review and progress exercises; continue with spine care education; manual work, DN, modalities as indicated    [employer], PT 07/16/2023, 1:00 PM

## 2023-07-16 ENCOUNTER — Encounter: Payer: Self-pay | Admitting: Rehabilitative and Restorative Service Providers"

## 2023-07-16 ENCOUNTER — Other Ambulatory Visit: Payer: Self-pay

## 2023-07-16 ENCOUNTER — Ambulatory Visit: Payer: 59 | Attending: Sports Medicine | Admitting: Rehabilitative and Restorative Service Providers"

## 2023-07-16 DIAGNOSIS — M6281 Muscle weakness (generalized): Secondary | ICD-10-CM | POA: Diagnosis not present

## 2023-07-16 DIAGNOSIS — M47816 Spondylosis without myelopathy or radiculopathy, lumbar region: Secondary | ICD-10-CM | POA: Insufficient documentation

## 2023-07-16 DIAGNOSIS — M5459 Other low back pain: Secondary | ICD-10-CM | POA: Diagnosis not present

## 2023-07-16 DIAGNOSIS — M5386 Other specified dorsopathies, lumbar region: Secondary | ICD-10-CM | POA: Insufficient documentation

## 2023-07-16 DIAGNOSIS — R293 Abnormal posture: Secondary | ICD-10-CM | POA: Insufficient documentation

## 2023-07-18 ENCOUNTER — Encounter: Payer: Self-pay | Admitting: Rehabilitative and Restorative Service Providers"

## 2023-07-18 ENCOUNTER — Ambulatory Visit: Payer: 59 | Admitting: Rehabilitative and Restorative Service Providers"

## 2023-07-18 DIAGNOSIS — M6281 Muscle weakness (generalized): Secondary | ICD-10-CM

## 2023-07-18 DIAGNOSIS — M47816 Spondylosis without myelopathy or radiculopathy, lumbar region: Secondary | ICD-10-CM | POA: Diagnosis not present

## 2023-07-18 DIAGNOSIS — M5386 Other specified dorsopathies, lumbar region: Secondary | ICD-10-CM | POA: Diagnosis not present

## 2023-07-18 DIAGNOSIS — R293 Abnormal posture: Secondary | ICD-10-CM | POA: Diagnosis not present

## 2023-07-18 DIAGNOSIS — M5459 Other low back pain: Secondary | ICD-10-CM

## 2023-07-18 NOTE — Therapy (Signed)
OUTPATIENT PHYSICAL THERAPY THORACOLUMBAR TREATMENT   Patient Name: Caroline Brown MRN: [id number] DOB:[birth year], 57 y.o., female Today's Date: 07/18/2023  END OF SESSION:  PT End of Session - 07/18/23 1016     Visit Number 2    Number of Visits 24    PT Start Time 1015    PT Stop Time 1103    PT Time Calculation (min) 48 min    Activity Tolerance Patient tolerated treatment well             Past Medical History:  Diagnosis Date   Hypertension    Myocardial infarction (HCC)    Six MIs per pt   Past Surgical History:  Procedure Laterality Date   CORONARY STENT PLACEMENT     Patient Active Problem List   Diagnosis Date Noted   Lumbar spondylosis 07/04/2023   Chronic foot pain, right 11/06/2021   Tibialis posterior tendinitis, left 05/18/2021   Closed fracture of base of fifth metatarsal bone of left foot 03/25/2020    PCP: Clearnce Hasten, PA-C  REFERRING PROVIDER: Dr Rodney Langton   REFERRING DIAG: Lumbar spondylosis  Rationale for Evaluation and Treatment: Rehabilitation  THERAPY DIAG:  Lumbar spondylosis  Other low back pain  Muscle weakness (generalized)  ONSET DATE: 06/26/22 increased symptoms  SUBJECTIVE:                                                                                                                                                                                           SUBJECTIVE STATEMENT: She did her exercises yesterday and is sore from starting exercises. Using the noodle at work and working to avoid crossing ankles.   Eval: Patient reports that she has constant pain in the LB. She has had pain for years. Symptoms have increased in the past year. X-rays show degenerative changes in the LB and hips R > L through the sacrum. She reports gradual onset of pain over the years.   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 Yes: NPRS  scale: 3-4/10 Pain location: LB/sacral area  Pain description: burning; sharp; constant Aggravating factors: anything; increased activity such as housework or walking; standing in line Relieving factors: heat; ice; pain patch; rest  PRECAUTIONS: None   WEIGHT BEARING RESTRICTIONS: No  FALLS:  Has patient fallen in last 6 months? 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; has Programme researcher, broadcasting/film/video; working on property  PATIENT GOALS: figure out  how to take some of the pain off  NEXT MD VISIT: 08/15/23  OBJECTIVE:   DIAGNOSTIC FINDINGS:  None available for spine   PATIENT SURVEYS:  FOTO 44; goal 58   COGNITION: Overall cognitive status: Within functional limits for tasks assessed     SENSATION: WFL  MUSCLE LENGTH: Hamstrings: Right 85 deg; Left 80 deg Tight bilat hip flexors   POSTURE: rounded shoulders, forward head, and flexed trunk   PALPATION: Muscular tightness in the R > L hip flexors; piriformis; gluts Pain with spring testing through the sacrum; L5/4 area   LUMBAR ROM:   AROM eval  Flexion 90%  Extension 60%  Right lateral flexion 70% pain R side   Left lateral flexion 75% mild pain L side   Right rotation 40%  Left rotation 45%   (Blank rows = not tested)  LOWER EXTREMITY ROM:   tight hip extension bilat    LOWER EXTREMITY MMT:  weak abdominals/core    MMT Right eval Left eval  Hip flexion    Hip extension 4 4  Hip abduction    Hip adduction    Hip internal rotation    Hip external rotation    Knee flexion    Knee extension    Ankle dorsiflexion    Ankle plantarflexion    Ankle inversion    Ankle eversion     (Blank rows = not tested)  LUMBAR SPECIAL TESTS:  Straight leg raise test: Negative and Slump test: Negative discomfort with slump test - no radicular pain   FUNCTIONAL TESTS:  5 times sit to stand:   GAIT: Distance walked: 40 Assistive device utilized: None Level of assistance: Complete Independence Comments: [facility]  [facility] Adult PT Treatment:                                                DATE: 07/18/23 Therapeutic Exercise:  Nustep L 5 x 5 min  Prone  Press up 2-3 sec x 8 Supine  4 part core x 10 Piriformis stretch 30 sec x 2 R/L  Sitting  Sit to stand hinged hip x 10  Standing  Mini wall squat 10 sec x 10  Counter pank 60 sec x 3  Ball release posterior hips  Manual Therapy: Add trial of DN at next visit Neuromuscular re-ed: Continue with postural correction  Therapeutic Activity: Sitting with noodle along spine  Modalities: TENS lower lumbar bilat in a line  Moist heat  Self Care: Importance of core strength    [facility] Adult PT Treatment:                                                DATE: 07/16/23 Therapeutic Exercise: Prone  Press up 2-3 sec x 8 Supine  4 part core x 10 Piriformis stretch 30 sec x 2 R/L  Standing  Ball release posterior hips  Manual Therapy: Add trial of DN  Neuromuscular re-ed: Continue with postural correction  Therapeutic Activity: Sitting with noodle along spine  Modalities: Add trial of TENS  Self Care: Importance of core strength     PATIENT EDUCATION:  Education details: POC; HEP Person educated: Patient Education method: Explanation, Demonstration, Tactile cues, Verbal cues, and Handouts Education comprehension:  verbalized understanding, returned demonstration, verbal cues required, tactile cues required, and needs further education  HOME EXERCISE PROGRAM: Access Code: [facility] URL: [email or web address] Date: 07/18/2023 Prepared by: Corlis Leak  Exercises - Prone Press Up  - 2 x daily - 7 x weekly - 1 sets - 10 reps - 2-3 sec  hold - Standing Lumbar Extension  - 2 x daily - 7 x weekly - 1 sets - 2-3 reps - 2-3 sec  hold - Supine Transversus Abdominis Bracing with Pelvic Floor Contraction  - 2 x daily - 7 x  weekly - 1 sets - 10 reps - 10sec  hold - Supine Piriformis Stretch with Leg Straight  - 2 x daily - 7 x weekly - 1 sets - 3 reps - 30 sec  hold - Standing Piriformis Release with Ball at [employer]  - 2 x daily - 7 x weekly - 30-60 sec  hold - Sit to Stand  - 2 x daily - 7 x weekly - 1 sets - 10 reps - 3-5 sec  hold - Wall Quarter Squat  - 2 x daily - 7 x weekly - 1-2 sets - 10 reps - 5-10 sec  hold - Plank on Counter  - 2 x daily - 7 x weekly - 1 sets - 3 reps - 30 sec  hold   Patient Education - Office Posture - Trigger Point Dry Needling  ASSESSMENT:  CLINICAL IMPRESSION: Patient reports soreness with initial exercises. Reviewed and corrected exercises. Added core stabilization exercises as tolerated with knee pain. Trial of TENS unit   OBJECTIVE IMPAIRMENTS: lumbar spondylosis; low back pain. She presents with poor core strength and stability; decreased trunk ROM/mobility; muscular tightness to palpation; constant pain; pain increased with functional activities decreased activity tolerance, decreased mobility, decreased ROM, decreased strength, increased fascial restrictions, impaired flexibility, improper body mechanics, postural dysfunction, and pain.    GOALS: Goals reviewed with patient? Yes  SHORT TERM GOALS: Target date: 08/27/2023   Independent in initial HEP  Baseline: Goal status: INITIAL  2.  Patient to demonstrate and/or verbalize improved posture and alignment for functional tasks  Baseline:  Goal status: INITIAL   LONG TERM GOALS: Target date: 10/08/2023   Decrease pain in LB and hips by 50-70% allowing patient to increase functional activity level without increased pain  Baseline:  Goal status: INITIAL  2.  Increase core strength and stability with patient to tolerate 30-40 minutes of strengthening exercises with minimal to no increase in pain  Baseline:  Goal status: INITIAL  3.  Patient reports increased tolerance for functional activities  Baseline:   Goal status: INITIAL  4.  5/5 bilat hip extension strength  Baseline:  Goal status: INITIAL  5.  Independent in HEP including aquatic exercise as indicated  Baseline:  Goal status: INITIAL  6.  Improve functional limitation score to 58  Baseline:  Goal status: INITIAL  PLAN:  PT FREQUENCY: 2x/week  PT DURATION: 12 weeks  PLANNED INTERVENTIONS: Therapeutic exercises, Therapeutic activity, Neuromuscular re-education, Balance training, Gait training, Patient/Family education, Self Care, Joint mobilization, Aquatic Therapy, Dry Needling, Electrical stimulation, Spinal mobilization, Cryotherapy, Moist heat, Taping, Traction, Ultrasound, Manual therapy, and Re-evaluation.  PLAN FOR NEXT SESSION: review and progress exercises; continue with spine care education; manual work, DN, modalities as indicated    Puneet Masoner Rober Minion, PT 07/18/2023, 10:17 AM

## 2023-07-23 ENCOUNTER — Ambulatory Visit: Payer: 59 | Admitting: Rehabilitative and Restorative Service Providers"

## 2023-07-23 ENCOUNTER — Encounter: Payer: Self-pay | Admitting: Rehabilitative and Restorative Service Providers"

## 2023-07-23 DIAGNOSIS — R293 Abnormal posture: Secondary | ICD-10-CM | POA: Diagnosis not present

## 2023-07-23 DIAGNOSIS — M5459 Other low back pain: Secondary | ICD-10-CM

## 2023-07-23 DIAGNOSIS — M5386 Other specified dorsopathies, lumbar region: Secondary | ICD-10-CM | POA: Diagnosis not present

## 2023-07-23 DIAGNOSIS — M6281 Muscle weakness (generalized): Secondary | ICD-10-CM | POA: Diagnosis not present

## 2023-07-23 DIAGNOSIS — M47816 Spondylosis without myelopathy or radiculopathy, lumbar region: Secondary | ICD-10-CM | POA: Diagnosis not present

## 2023-07-23 NOTE — Therapy (Signed)
OUTPATIENT PHYSICAL THERAPY THORACOLUMBAR TREATMENT   Patient Name: Caroline Brown MRN: [id number] DOB:12-13-1965, 57 y.o., female Today's Date: 07/23/2023  END OF SESSION:  PT End of Session - 07/23/23 0843     Visit Number 3    Number of Visits 24    Date for PT Re-Evaluation 10/08/23    PT Start Time 0843    PT Stop Time 0932    PT Time Calculation (min) 49 min    Activity Tolerance Patient tolerated treatment well             Past Medical History:  Diagnosis Date   Hypertension    Myocardial infarction (HCC)    Six MIs per pt   Past Surgical History:  Procedure Laterality Date   CORONARY STENT PLACEMENT     Patient Active Problem List   Diagnosis Date Noted   Lumbar spondylosis 07/04/2023   Chronic foot pain, right 11/06/2021   Tibialis posterior tendinitis, left 05/18/2021   Closed fracture of base of fifth metatarsal bone of left foot 03/25/2020    PCP: Clearnce Hasten, PA-C  REFERRING PROVIDER: Dr Rodney Langton   REFERRING DIAG: Lumbar spondylosis  Rationale for Evaluation and Treatment: Rehabilitation  THERAPY DIAG:  Lumbar spondylosis  Other low back pain  Muscle weakness (generalized)  ONSET DATE: 06/26/22 increased symptoms  SUBJECTIVE:                                                                                                                                                                                           SUBJECTIVE STATEMENT: Patient reports that the sharp pain she was having in the LB is no longer there. She has some task oriented pain with things like standing at kitchen sink which can get up to 6-7/10 if she is standing for a long time. She has not used a pain patch or taken OTC antiinflammatory is a week and a half. She is working on exercises. Using the noodle at work and working to avoid crossing ankles.   Eval: Patient reports that she has constant pain in the LB. She has had pain for years. Symptoms have increased in  the past year. X-rays show degenerative changes in the LB and hips R > L through the sacrum. She reports gradual onset of pain over the years.   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 Yes: NPRS scale: 0/10 Pain location: LB/sacral area  Pain description: burning; sharp; constant Aggravating factors: anything; increased activity such as housework or walking; standing in line Relieving  factors: heat; ice; pain patch; rest  PRECAUTIONS: None   WEIGHT BEARING RESTRICTIONS: No  FALLS:  Has patient fallen in last 6 months? 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; has Programme researcher, broadcasting/film/video; working on property  PATIENT GOALS: figure out how to take some of the pain off  NEXT MD VISIT: 08/15/23  OBJECTIVE:   DIAGNOSTIC FINDINGS:  None available for spine   PATIENT SURVEYS:  FOTO 44; goal 58   SENSATION: WFL  MUSCLE LENGTH: Hamstrings: Right 85 deg; Left 80 deg Tight bilat hip flexors   POSTURE: rounded shoulders, forward head, and flexed trunk   PALPATION: Muscular tightness in the R > L hip flexors; piriformis; gluts Pain with spring testing through the sacrum; L5/4 area   LUMBAR ROM:   AROM eval  Flexion 90%  Extension 60%  Right lateral flexion 70% pain R side   Left lateral flexion 75% mild pain L side   Right rotation 40%  Left rotation 45%   (Blank rows = not tested)  LOWER EXTREMITY ROM:   tight hip extension bilat    LOWER EXTREMITY MMT:  weak abdominals/core    MMT Right eval Left eval  Hip flexion    Hip extension 4 4  Hip abduction    Hip adduction    Hip internal rotation    Hip external rotation    Knee flexion    Knee extension    Ankle dorsiflexion    Ankle plantarflexion    Ankle inversion    Ankle eversion      (Blank rows = not tested)  LUMBAR SPECIAL TESTS:  Straight leg raise test: Negative and Slump test: Negative discomfort with slump test - no radicular pain   FUNCTIONAL TESTS:  5 times sit to stand:  GAIT: Distance walked: 40 Assistive device utilized: None Level of assistance: Complete Independence Comments: WFL  [facility] Adult PT Treatment:                                                DATE: 07/23/23 Therapeutic Exercise:  Nustep L 6 x 5 min  Prone  Press up 2-3 sec x 8 Supine  4 part core x 10 HEP Piriformis stretch 30 sec x 2 R/L HEP Sitting  Sit to stand hinged hip x 10  Standing  Mini wall squat 10 sec x 10  Counter pank 60 sec x 3  Ball release posterior hips  Row blue TB 3 sec x 10 Shoulder extension blue TB 3 sec x 10  Antirotation one strip blue TB 3 sec x 10 R/L  Manual Therapy: Skilled palpation to assess response to manual work and dry needling  Trigger Point Dry-Needling  Treatment instructions: Expect mild to moderate muscle soreness. S/S of pneumothorax if dry needled over a lung field, and to seek immediate medical attention should they occur. Patient verbalized understanding of these instructions and education.  Patient Consent Given: Yes Education handout provided: Yes Muscles treated: R/L lumbosacral; posterior hip at iliac crest - gluts  Electrical stimulation performed: Yes Parameters:  mAmp current intensity to pt tolerance Treatment response/outcome: decreased palpable tightness   Neuromuscular re-ed: Continue with postural correction  Therapeutic Activity: Sitting with noodle along spine  Modalities: TENS lower lumbar bilat in  a line  Moist heat  Self Care: Importance of core strength   [facility] Adult PT Treatment:                                                DATE: 07/18/23 Therapeutic Exercise:  Nustep L 5 x 5 min  Prone  Press up 2-3 sec x 8 Supine  4 part core x 10 Piriformis stretch 30 sec x 2 R/L  Sitting  Sit to stand hinged hip x  10  Standing  Mini wall squat 10 sec x 10  Counter pank 60 sec x 3  Ball release posterior hips  Manual Therapy: Add trial of DN at next visit Neuromuscular re-ed: Continue with postural correction  Therapeutic Activity: Sitting with noodle along spine  Modalities: TENS lower lumbar bilat in a line  Moist heat  Self Care: Importance of core strength     PATIENT EDUCATION:  Education details: POC; HEP Person educated: Patient Education method: Explanation, Demonstration, Tactile cues, Verbal cues, and Handouts Education comprehension: verbalized understanding, returned demonstration, verbal cues required, tactile cues required, and needs further education  HOME EXERCISE PROGRAM: Access Code: [facility] URL: [email or web address] Date: 07/23/2023 Prepared by: Corlis Leak  Exercises - Prone Press Up  - 2 x daily - 7 x weekly - 1 sets - 10 reps - 2-3 sec  hold - Standing Lumbar Extension  - 2 x daily - 7 x weekly - 1 sets - 2-3 reps - 2-3 sec  hold - Supine Transversus Abdominis Bracing with Pelvic Floor Contraction  - 2 x daily - 7 x weekly - 1 sets - 10 reps - 10sec  hold - Supine Piriformis Stretch with Leg Straight  - 2 x daily - 7 x weekly - 1 sets - 3 reps - 30 sec  hold - Standing Piriformis Release with Ball at [employer]  - 2 x daily - 7 x weekly - 30-60 sec  hold - Sit to Stand  - 2 x daily - 7 x weekly - 1 sets - 10 reps - 3-5 sec  hold - Wall Quarter Squat  - 2 x daily - 7 x weekly - 1-2 sets - 10 reps - 5-10 sec  hold - Plank on Counter  - 2 x daily - 7 x weekly - 1 sets - 3 reps - 30 sec  hold - Standing Bilateral Low Shoulder Row with Anchored Resistance  - 2 x daily - 7 x weekly - 1-3 sets - 10 reps - 2-3 sec  hold - Shoulder extension with resistance - Neutral  - 1 x daily - 7 x weekly - 1-2 sets - 10 reps - 3-5 sec  hold - Anti-Rotation Lateral Stepping with Press  - 2 x daily - 7 x weekly - 1-2 sets - 10 reps - 2-3 sec  hold   Patient Education -  Office Posture - Trigger Point Dry Needling  ASSESSMENT:  CLINICAL IMPRESSION: Patient reports decrease pain and soreness with functional activities. Trial of dry needling bilat lumbosacral tightness tolerated well. Reviewed and progressed exercises with focus on core stabilization exercises as tolerated with knee pain. Continue with TENS unit for pain management  OBJECTIVE IMPAIRMENTS: lumbar spondylosis; low back pain. She presents with poor core strength and stability; decreased trunk ROM/mobility; muscular tightness to palpation; constant pain; pain increased with functional activities decreased  activity tolerance, decreased mobility, decreased ROM, decreased strength, increased fascial restrictions, impaired flexibility, improper body mechanics, postural dysfunction, and pain.    GOALS: Goals reviewed with patient? Yes  SHORT TERM GOALS: Target date: 08/27/2023   Independent in initial HEP  Baseline: Goal status: INITIAL  2.  Patient to demonstrate and/or verbalize improved posture and alignment for functional tasks  Baseline:  Goal status: INITIAL   LONG TERM GOALS: Target date: 10/08/2023   Decrease pain in LB and hips by 50-70% allowing patient to increase functional activity level without increased pain  Baseline:  Goal status: INITIAL  2.  Increase core strength and stability with patient to tolerate 30-40 minutes of strengthening exercises with minimal to no increase in pain  Baseline:  Goal status: INITIAL  3.  Patient reports increased tolerance for functional activities  Baseline:  Goal status: INITIAL  4.  5/5 bilat hip extension strength  Baseline:  Goal status: INITIAL  5.  Independent in HEP including aquatic exercise as indicated  Baseline:  Goal status: INITIAL  6.  Improve functional limitation score to 58  Baseline:  Goal status: INITIAL  PLAN:  PT FREQUENCY: 2x/week  PT DURATION: 12 weeks  PLANNED INTERVENTIONS: Therapeutic exercises,  Therapeutic activity, Neuromuscular re-education, Balance training, Gait training, Patient/Family education, Self Care, Joint mobilization, Aquatic Therapy, Dry Needling, Electrical stimulation, Spinal mobilization, Cryotherapy, Moist heat, Taping, Traction, Ultrasound, Manual therapy, and Re-evaluation.  PLAN FOR NEXT SESSION: review and progress exercises; continue with spine care education; manual work, DN, modalities as indicated    Olyver Hawes Rober Minion, PT 07/23/2023, 8:43 AM

## 2023-07-25 ENCOUNTER — Ambulatory Visit: Payer: 59 | Admitting: Rehabilitative and Restorative Service Providers"

## 2023-07-30 ENCOUNTER — Ambulatory Visit: Payer: 59 | Attending: Sports Medicine | Admitting: Rehabilitative and Restorative Service Providers"

## 2023-07-30 ENCOUNTER — Encounter: Payer: Self-pay | Admitting: Rehabilitative and Restorative Service Providers"

## 2023-07-30 DIAGNOSIS — M47816 Spondylosis without myelopathy or radiculopathy, lumbar region: Secondary | ICD-10-CM | POA: Diagnosis present

## 2023-07-30 DIAGNOSIS — M5459 Other low back pain: Secondary | ICD-10-CM | POA: Insufficient documentation

## 2023-07-30 DIAGNOSIS — M6281 Muscle weakness (generalized): Secondary | ICD-10-CM | POA: Diagnosis present

## 2023-07-30 NOTE — Therapy (Signed)
OUTPATIENT PHYSICAL THERAPY THORACOLUMBAR TREATMENT   Patient Name: Caroline Brown MRN: [id number] DOB:[birth year], 57 y.o., female Today's Date: 07/30/2023  END OF SESSION:  PT End of Session - 07/30/23 0839     Visit Number 4    Number of Visits 24    Date for PT Re-Evaluation 10/08/23    Authorization Type [employer] 5000 deduct met; 60/40 copay    Authorization Time Period per year    Authorization - Visit Number 4    Authorization - Number of Visits 30    PT Start Time [phone]    PT Stop Time 0928    PT Time Calculation (min) 49 min    Activity Tolerance Patient tolerated treatment well             Past Medical History:  Diagnosis Date   Hypertension    Myocardial infarction (HCC)    Six MIs per pt   Past Surgical History:  Procedure Laterality Date   CORONARY STENT PLACEMENT     Patient Active Problem List   Diagnosis Date Noted   Lumbar spondylosis 07/04/2023   Chronic foot pain, right 11/06/2021   Tibialis posterior tendinitis, left 05/18/2021   Closed fracture of base of fifth metatarsal bone of left foot 03/25/2020    PCP: Clearnce Hasten, PA-C  REFERRING PROVIDER: Dr Rodney Langton   REFERRING DIAG: Lumbar spondylosis  Rationale for Evaluation and Treatment: Rehabilitation  THERAPY DIAG:  Lumbar spondylosis  Other low back pain  Muscle weakness (generalized)  ONSET DATE: 06/26/22 increased symptoms  SUBJECTIVE:                                                                                                                                                                                           SUBJECTIVE STATEMENT: Patient reports that has some discomfort but not the sharp pain she was having in the LB. She has some pain with task oriented pain with things like standing at kitchen sink or standing cooking which can get up to 5-6/10. She sat down and rested and pain resolved. She has not used a pain patch or taken OTC antiinflammatory is a week  and a half. She is working on exercises. Using the noodle at work and working to avoid crossing ankles.   Eval: Patient reports that she has constant pain in the LB. She has had pain for years. Symptoms have increased in the past year. X-rays show degenerative changes in the LB and hips R > L through the sacrum. She reports gradual onset of pain over the years.   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 Yes: NPRS scale: 0/10 Pain location: LB/sacral area  Pain description: burning; sharp; constant Aggravating factors: anything; increased activity such as housework or walking; standing in line Relieving factors: heat; ice; pain patch; rest  PRECAUTIONS: None   WEIGHT BEARING RESTRICTIONS: No  FALLS:  Has patient fallen in last 6 months? 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; has Programme researcher, broadcasting/film/video; working on property  PATIENT GOALS: figure out how to take some of the pain off  NEXT MD VISIT: 08/15/23  OBJECTIVE:   DIAGNOSTIC FINDINGS:  None available for spine   PATIENT SURVEYS:  FOTO 44; goal 58   SENSATION: WFL  MUSCLE LENGTH: Hamstrings: Right 85 deg; Left 80 deg Tight bilat hip flexors   POSTURE: rounded shoulders, forward head, and flexed trunk   PALPATION: Muscular tightness in the R > L hip flexors; piriformis; gluts Pain with spring testing through the sacrum; L5/4 area   LUMBAR ROM:   AROM eval  Flexion 90%  Extension 60%  Right lateral flexion 70% pain R side   Left lateral flexion 75% mild pain L side   Right rotation 40%  Left rotation 45%   (Blank rows = not tested)  LOWER EXTREMITY ROM:   tight hip extension bilat    LOWER EXTREMITY MMT:  weak abdominals/core    MMT Right eval Left eval  Hip flexion    Hip extension  4 4  Hip abduction    Hip adduction    Hip internal rotation    Hip external rotation    Knee flexion    Knee extension    Ankle dorsiflexion    Ankle plantarflexion    Ankle inversion    Ankle eversion     (Blank rows = not tested)  LUMBAR SPECIAL TESTS:  Straight leg raise test: Negative and Slump test: Negative discomfort with slump test - no radicular pain   FUNCTIONAL TESTS:  5 times sit to stand:  GAIT: Distance walked: 40 Assistive device utilized: None Level of assistance: Complete Independence Comments: [facility]  [facility] Adult PT Treatment:                                                DATE: 07/30/23 Therapeutic Exercise:  Nustep L 7 x 5 min  Prone  Press up 2-3 sec x 8 Supine  4 part core x 10 HEP Piriformis stretch 30 sec x 2 R/L HEP Sitting  Sit to stand hinged hip x 10  Standing  Mini wall squat 10 sec x 10  Counter pank 60 sec x 3  Row blue TB 3 sec x 10 Shoulder extension blue TB 3 sec x 10  Antirotation one strip blue TB 3 sec x 10 R/L  Ball release posterior hips  Manual Therapy: Skilled palpation to assess response to manual work and dry needling  Trigger Point Dry-Needling  Treatment instructions: Expect mild to moderate muscle soreness. S/S of pneumothorax if dry needled over a lung field, and to seek immediate medical attention should they occur. Patient verbalized understanding of these instructions and education.  Patient Consent Given: Yes Education handout provided: Yes Muscles treated: R/L lumbosacral; posterior hip at iliac crest -  gluts  Electrical stimulation performed: Yes Parameters: mAmp current intensity to pt tolerance Treatment response/outcome: decreased palpable tightness   Neuromuscular re-ed: Continue with postural correction  Therapeutic Activity: Sitting with noodle along spine  Modalities: TENS lower lumbar bilat in a line  Moist heat  Self Care: Importance of core strength    [facility] Adult PT Treatment:                                                 DATE: 07/23/23 Therapeutic Exercise:  Nustep L 6 x 5 min  Prone  Press up 2-3 sec x 8 Supine  4 part core x 10 HEP Piriformis stretch 30 sec x 2 R/L HEP Sitting  Sit to stand hinged hip x 10  Standing  Mini wall squat 10 sec x 10  Counter pank 60 sec x 3  Ball release posterior hips  Row blue TB 3 sec x 10 Shoulder extension blue TB 3 sec x 10  Antirotation one strip blue TB 3 sec x 10 R/L  Manual Therapy: Skilled palpation to assess response to manual work and dry needling  Trigger Point Dry-Needling  Treatment instructions: Expect mild to moderate muscle soreness. S/S of pneumothorax if dry needled over a lung field, and to seek immediate medical attention should they occur. Patient verbalized understanding of these instructions and education.  Patient Consent Given: Yes Education handout provided: Yes Muscles treated: R/L lumbosacral; posterior hip at iliac crest - gluts  Electrical stimulation performed: Yes Parameters:  mAmp current intensity to pt tolerance Treatment response/outcome: decreased palpable tightness   Neuromuscular re-ed: Continue with postural correction  Therapeutic Activity: Sitting with noodle along spine  Modalities: TENS lower lumbar bilat in a line  Moist heat  Self Care: Importance of core strength    PATIENT EDUCATION:  Education details: POC; HEP Person educated: Patient Education method: Explanation, Demonstration, Tactile cues, Verbal cues, and Handouts Education comprehension: verbalized understanding, returned demonstration, verbal cues required, tactile cues required, and needs further education  HOME EXERCISE PROGRAM: Access Code: [facility] URL: [email or web address] Date: 07/23/2023 Prepared by: Corlis Leak  Exercises - Prone Press Up  - 2 x daily - 7 x weekly - 1 sets - 10 reps - 2-3 sec  hold - Standing Lumbar Extension  - 2 x daily - 7 x weekly - 1 sets - 2-3 reps - 2-3 sec  hold -  Supine Transversus Abdominis Bracing with Pelvic Floor Contraction  - 2 x daily - 7 x weekly - 1 sets - 10 reps - 10sec  hold - Supine Piriformis Stretch with Leg Straight  - 2 x daily - 7 x weekly - 1 sets - 3 reps - 30 sec  hold - Standing Piriformis Release with Ball at [employer]  - 2 x daily - 7 x weekly - 30-60 sec  hold - Sit to Stand  - 2 x daily - 7 x weekly - 1 sets - 10 reps - 3-5 sec  hold - Wall Quarter Squat  - 2 x daily - 7 x weekly - 1-2 sets - 10 reps - 5-10 sec  hold - Plank on Counter  - 2 x daily - 7 x weekly - 1 sets - 3 reps - 30 sec  hold - Standing Bilateral Low Shoulder Row with Anchored Resistance  - 2 x daily - 7 x  weekly - 1-3 sets - 10 reps - 2-3 sec  hold - Shoulder extension with resistance - Neutral  - 1 x daily - 7 x weekly - 1-2 sets - 10 reps - 3-5 sec  hold - Anti-Rotation Lateral Stepping with Press  - 2 x daily - 7 x weekly - 1-2 sets - 10 reps - 2-3 sec  hold   Patient Education - Office Posture - Trigger Point Dry Needling  ASSESSMENT:  CLINICAL IMPRESSION: Positive response to treatment including DN and core strengthening. Patient reports decrease pain and soreness with functional activities. Continue with dry needling bilat lumbosacral tightness - tolerated well. Reviewed and progressed exercises with focus on core stabilization exercises as tolerated with knee pain. Continue with TENS unit for pain management  OBJECTIVE IMPAIRMENTS: lumbar spondylosis; low back pain. She presents with poor core strength and stability; decreased trunk ROM/mobility; muscular tightness to palpation; constant pain; pain increased with functional activities decreased activity tolerance, decreased mobility, decreased ROM, decreased strength, increased fascial restrictions, impaired flexibility, improper body mechanics, postural dysfunction, and pain.    GOALS: Goals reviewed with patient? Yes  SHORT TERM GOALS: Target date: 08/27/2023   Independent in initial HEP   Baseline: Goal status: INITIAL  2.  Patient to demonstrate and/or verbalize improved posture and alignment for functional tasks  Baseline:  Goal status: INITIAL   LONG TERM GOALS: Target date: 10/08/2023   Decrease pain in LB and hips by 50-70% allowing patient to increase functional activity level without increased pain  Baseline:  Goal status: INITIAL  2.  Increase core strength and stability with patient to tolerate 30-40 minutes of strengthening exercises with minimal to no increase in pain  Baseline:  Goal status: INITIAL  3.  Patient reports increased tolerance for functional activities  Baseline:  Goal status: INITIAL  4.  5/5 bilat hip extension strength  Baseline:  Goal status: INITIAL  5.  Independent in HEP including aquatic exercise as indicated  Baseline:  Goal status: INITIAL  6.  Improve functional limitation score to 58  Baseline:  Goal status: INITIAL  PLAN:  PT FREQUENCY: 2x/week  PT DURATION: 12 weeks  PLANNED INTERVENTIONS: Therapeutic exercises, Therapeutic activity, Neuromuscular re-education, Balance training, Gait training, Patient/Family education, Self Care, Joint mobilization, Aquatic Therapy, Dry Needling, Electrical stimulation, Spinal mobilization, Cryotherapy, Moist heat, Taping, Traction, Ultrasound, Manual therapy, and Re-evaluation.  PLAN FOR NEXT SESSION: review and progress exercises; continue with spine care education; manual work, DN, modalities as indicated    Maycee Blasco Rober Minion, PT 07/30/2023, 8:42 AM

## 2023-08-01 ENCOUNTER — Ambulatory Visit: Payer: 59 | Admitting: Rehabilitative and Restorative Service Providers"

## 2023-08-03 DIAGNOSIS — E119 Type 2 diabetes mellitus without complications: Secondary | ICD-10-CM | POA: Diagnosis not present

## 2023-08-07 ENCOUNTER — Ambulatory Visit: Payer: 59 | Admitting: Rehabilitative and Restorative Service Providers"

## 2023-08-07 ENCOUNTER — Encounter: Payer: Self-pay | Admitting: Rehabilitative and Restorative Service Providers"

## 2023-08-07 DIAGNOSIS — M5459 Other low back pain: Secondary | ICD-10-CM | POA: Diagnosis not present

## 2023-08-07 DIAGNOSIS — M47816 Spondylosis without myelopathy or radiculopathy, lumbar region: Secondary | ICD-10-CM

## 2023-08-07 DIAGNOSIS — M6281 Muscle weakness (generalized): Secondary | ICD-10-CM

## 2023-08-07 NOTE — Therapy (Signed)
OUTPATIENT PHYSICAL THERAPY THORACOLUMBAR TREATMENT   Patient Name: Caroline Brown MRN: [id number] DOB:10/18/1966, 57 y.o., female Today's Date: 08/07/2023  END OF SESSION:  PT End of Session - 08/07/23 0815     Visit Number 5    Number of Visits 24    Date for PT Re-Evaluation 10/08/23    Authorization Type [employer] 5000 deduct met; 60/40 copay    Authorization Time Period per year    Authorization - Visit Number 5    Authorization - Number of Visits 30    PT Start Time 0810    PT Stop Time 0848    PT Time Calculation (min) 38 min             Past Medical History:  Diagnosis Date   Hypertension    Myocardial infarction (HCC)    Six MIs per pt   Past Surgical History:  Procedure Laterality Date   CORONARY STENT PLACEMENT     Patient Active Problem List   Diagnosis Date Noted   Lumbar spondylosis 07/04/2023   Chronic foot pain, right 11/06/2021   Tibialis posterior tendinitis, left 05/18/2021   Closed fracture of base of fifth metatarsal bone of left foot 03/25/2020    PCP: Clearnce Hasten, PA-C  REFERRING PROVIDER: Dr Rodney Langton   REFERRING DIAG: Lumbar spondylosis  Rationale for Evaluation and Treatment: Rehabilitation  THERAPY DIAG:  Lumbar spondylosis  Other low back pain  Muscle weakness (generalized)  ONSET DATE: 06/26/22 increased symptoms  SUBJECTIVE:                                                                                                                                                                                           SUBJECTIVE STATEMENT: Patient reports that she has increased pain in the LB today which she feels is related to busy weekend and running out of her medication. It is also time for her period yesterday and today. She will use a pain patch or taken OTC antiinflammatory is a week and a half. She is working on exercises. Using the noodle at work and working to avoid crossing ankles.   Eval: Patient reports that  she has constant pain in the LB. She has had pain for years. Symptoms have increased in the past year. X-rays show degenerative changes in the LB and hips R > L through the sacrum. She reports gradual onset of pain over the years.   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 Yes: NPRS scale: 6-7/10 Pain location: LB/sacral area  Pain description: burning; sharp; constant Aggravating factors: anything; increased activity such as housework or walking; standing in line Relieving factors: heat; ice; pain patch; rest  PRECAUTIONS: None   WEIGHT BEARING RESTRICTIONS: No  FALLS:  Has patient fallen in last 6 months? 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; has Programme researcher, broadcasting/film/video; working on property  PATIENT GOALS: figure out how to take some of the pain off  NEXT MD VISIT: 08/15/23  OBJECTIVE:   DIAGNOSTIC FINDINGS:  None available for spine   PATIENT SURVEYS:  FOTO 44; goal 58   SENSATION: WFL  MUSCLE LENGTH: Hamstrings: Right 85 deg; Left 80 deg Tight bilat hip flexors   POSTURE: rounded shoulders, forward head, and flexed trunk   PALPATION: Muscular tightness in the R > L hip flexors; piriformis; gluts Pain with spring testing through the sacrum; L5/4 area   LUMBAR ROM:   AROM eval  Flexion 90%  Extension 60%  Right lateral flexion 70% pain R side   Left lateral flexion 75% mild pain L side   Right rotation 40%  Left rotation 45%   (Blank rows = not tested)  LOWER EXTREMITY ROM:   tight hip extension bilat    LOWER EXTREMITY MMT:  weak abdominals/core    MMT Right eval Left eval  Hip flexion    Hip extension 4 4  Hip abduction    Hip adduction    Hip internal rotation    Hip external rotation    Knee flexion    Knee extension     Ankle dorsiflexion    Ankle plantarflexion    Ankle inversion    Ankle eversion     (Blank rows = not tested)  LUMBAR SPECIAL TESTS:  Straight leg raise test: Negative and Slump test: Negative discomfort with slump test - no radicular pain   FUNCTIONAL TESTS:  5 times sit to stand:  GAIT: Distance walked: 40 Assistive device utilized: None Level of assistance: Complete Independence Comments: WFL  [facility] Adult PT Treatment:                                                DATE: 08/07/23 Therapeutic Exercise:  Nustep L 7 x 5 min  Prone  Press up 2-3 sec x 8 Glut set 10 sec x 10  Supine  4 part core x 10 HEP Piriformis stretch 30 sec x 2 R/L HEP Sitting  Sit to stand hinged hip x 10  Standing  Mini wall squat 10 sec x 10  Wall plank 60 sec x 1; Counter pank 60 sec x 2  Row blue TB 3 sec x 10 Shoulder extension blue TB 3 sec x 10  Antirotation one strip blue TB 3 sec x 10 R/L  Ball release posterior hips  Manual Therapy:  Neuromuscular re-ed: Continue with postural correction  Therapeutic Activity: Sitting with noodle along spine  Modalities: TENS lower lumbar bilat in a line  Moist heat  Self Care: Discussed sitting posture and alignment - modifying chairs for home and work Importance of core strength    [facility] Adult PT Treatment:  DATE: 07/30/23 Therapeutic Exercise:  Nustep L 7 x 5 min  Prone  Press up 2-3 sec x 8 Supine  4 part core x 10 HEP Piriformis stretch 30 sec x 2 R/L HEP Sitting  Sit to stand hinged hip x 10  Standing  Mini wall squat 10 sec x 10  Counter pank 60 sec x 3  Row blue TB 3 sec x 10 Shoulder extension blue TB 3 sec x 10  Antirotation one strip blue TB 3 sec x 10 R/L  Ball release posterior hips  Manual Therapy: Skilled palpation to assess response to manual work and dry needling  Trigger Point Dry-Needling  Treatment instructions: Expect mild to moderate muscle soreness. S/S of pneumothorax  if dry needled over a lung field, and to seek immediate medical attention should they occur. Patient verbalized understanding of these instructions and education.  Patient Consent Given: Yes Education handout provided: Yes Muscles treated: R/L lumbosacral; posterior hip at iliac crest - gluts  Electrical stimulation performed: Yes Parameters: mAmp current intensity to pt tolerance Treatment response/outcome: decreased palpable tightness   Neuromuscular re-ed: Continue with postural correction  Therapeutic Activity: Sitting with noodle along spine  Modalities: TENS lower lumbar bilat in a line  Moist heat  Self Care: Importance of core strength   PATIENT EDUCATION:  Education details: POC; HEP Person educated: Patient Education method: Explanation, Demonstration, Tactile cues, Verbal cues, and Handouts Education comprehension: verbalized understanding, returned demonstration, verbal cues required, tactile cues required, and needs further education  HOME EXERCISE PROGRAM: Access Code: [facility] URL: [email or web address] Date: 07/23/2023 Prepared by: Corlis Leak  Exercises - Prone Press Up  - 2 x daily - 7 x weekly - 1 sets - 10 reps - 2-3 sec  hold - Standing Lumbar Extension  - 2 x daily - 7 x weekly - 1 sets - 2-3 reps - 2-3 sec  hold - Supine Transversus Abdominis Bracing with Pelvic Floor Contraction  - 2 x daily - 7 x weekly - 1 sets - 10 reps - 10sec  hold - Supine Piriformis Stretch with Leg Straight  - 2 x daily - 7 x weekly - 1 sets - 3 reps - 30 sec  hold - Standing Piriformis Release with Ball at [employer]  - 2 x daily - 7 x weekly - 30-60 sec  hold - Sit to Stand  - 2 x daily - 7 x weekly - 1 sets - 10 reps - 3-5 sec  hold - Wall Quarter Squat  - 2 x daily - 7 x weekly - 1-2 sets - 10 reps - 5-10 sec  hold - Plank on Counter  - 2 x daily - 7 x weekly - 1 sets - 3 reps - 30 sec  hold - Standing Bilateral Low Shoulder Row with Anchored Resistance  - 2 x daily -  7 x weekly - 1-3 sets - 10 reps - 2-3 sec  hold - Shoulder extension with resistance - Neutral  - 1 x daily - 7 x weekly - 1-2 sets - 10 reps - 3-5 sec  hold - Anti-Rotation Lateral Stepping with Press  - 2 x daily - 7 x weekly - 1-2 sets - 10 reps - 2-3 sec  hold   Patient Education - Office Posture - Trigger Point Dry Needling  ASSESSMENT:  CLINICAL IMPRESSION: Flare up of LBP today which patient feels is related to the busy weekend and running out of medication. Continued with strengthening and core stabilization. Reviewed and  progressed exercises with focus on core stabilization exercises as tolerated with knee pain. Continue with TENS unit for pain management  OBJECTIVE IMPAIRMENTS: lumbar spondylosis; low back pain. She presents with poor core strength and stability; decreased trunk ROM/mobility; muscular tightness to palpation; constant pain; pain increased with functional activities decreased activity tolerance, decreased mobility, decreased ROM, decreased strength, increased fascial restrictions, impaired flexibility, improper body mechanics, postural dysfunction, and pain.    GOALS: Goals reviewed with patient? Yes  SHORT TERM GOALS: Target date: 08/27/2023   Independent in initial HEP  Baseline: Goal status: INITIAL  2.  Patient to demonstrate and/or verbalize improved posture and alignment for functional tasks  Baseline:  Goal status: INITIAL   LONG TERM GOALS: Target date: 10/08/2023   Decrease pain in LB and hips by 50-70% allowing patient to increase functional activity level without increased pain  Baseline:  Goal status: INITIAL  2.  Increase core strength and stability with patient to tolerate 30-40 minutes of strengthening exercises with minimal to no increase in pain  Baseline:  Goal status: INITIAL  3.  Patient reports increased tolerance for functional activities  Baseline:  Goal status: INITIAL  4.  5/5 bilat hip extension strength  Baseline:   Goal status: INITIAL  5.  Independent in HEP including aquatic exercise as indicated  Baseline:  Goal status: INITIAL  6.  Improve functional limitation score to 58  Baseline:  Goal status: INITIAL  PLAN:  PT FREQUENCY: 2x/week  PT DURATION: 12 weeks  PLANNED INTERVENTIONS: Therapeutic exercises, Therapeutic activity, Neuromuscular re-education, Balance training, Gait training, Patient/Family education, Self Care, Joint mobilization, Aquatic Therapy, Dry Needling, Electrical stimulation, Spinal mobilization, Cryotherapy, Moist heat, Taping, Traction, Ultrasound, Manual therapy, and Re-evaluation.  PLAN FOR NEXT SESSION: review and progress exercises; continue with spine care education; manual work, DN, modalities as indicated    [employer], PT 08/07/2023, 8:16 AM

## 2023-08-12 DIAGNOSIS — R232 Flushing: Secondary | ICD-10-CM | POA: Diagnosis not present

## 2023-08-12 DIAGNOSIS — E119 Type 2 diabetes mellitus without complications: Secondary | ICD-10-CM | POA: Diagnosis not present

## 2023-08-12 DIAGNOSIS — M7702 Medial epicondylitis, left elbow: Secondary | ICD-10-CM | POA: Diagnosis not present

## 2023-08-12 DIAGNOSIS — R4586 Emotional lability: Secondary | ICD-10-CM | POA: Diagnosis not present

## 2023-08-14 ENCOUNTER — Ambulatory Visit: Payer: 59 | Admitting: Rehabilitative and Restorative Service Providers"

## 2023-08-14 ENCOUNTER — Encounter: Payer: Self-pay | Admitting: Rehabilitative and Restorative Service Providers"

## 2023-08-14 DIAGNOSIS — M5459 Other low back pain: Secondary | ICD-10-CM

## 2023-08-14 DIAGNOSIS — M6281 Muscle weakness (generalized): Secondary | ICD-10-CM | POA: Diagnosis not present

## 2023-08-14 DIAGNOSIS — M47816 Spondylosis without myelopathy or radiculopathy, lumbar region: Secondary | ICD-10-CM | POA: Diagnosis not present

## 2023-08-14 NOTE — Therapy (Addendum)
 OUTPATIENT PHYSICAL THERAPY THORACOLUMBAR TREATMENT PHYSICAL THERAPY DISCHARGE SUMMARY  Visits from Start of Care: 6  Current functional level related to goals / functional outcomes: See progress note for discharge status    Remaining deficits: Unknown    Education / Equipment: HEP    Patient agrees to discharge. Patient goals were partially met. Patient is being discharged due to not returning since the last visit.  Rebekkah Powless P. Leonor Liv PT, MPH 02/04/24 2:26 PM     Patient Name: Caroline Brown MRN: [id number] DOB:Mar 26, 1966, 57 y.o., female Today's Date: 08/14/2023  END OF SESSION:  PT End of Session - 08/14/23 0807     Visit Number 6    Number of Visits 24    Date for PT Re-Evaluation 10/08/23    Authorization Type [employer] 5000 deduct met; 60/40 copay    Authorization Time Period per year    Authorization - Visit Number 6    Authorization - Number of Visits 30    PT Start Time 0804    PT Stop Time 0852    PT Time Calculation (min) 48 min    Activity Tolerance Patient tolerated treatment well             Past Medical History:  Diagnosis Date   Hypertension    Myocardial infarction (HCC)    Six MIs per pt   Past Surgical History:  Procedure Laterality Date   CORONARY STENT PLACEMENT     Patient Active Problem List   Diagnosis Date Noted   Lumbar spondylosis 07/04/2023   Chronic foot pain, right 11/06/2021   Tibialis posterior tendinitis, left 05/18/2021   Closed fracture of base of fifth metatarsal bone of left foot 03/25/2020    PCP: Clearnce Hasten, PA-C  REFERRING PROVIDER: Dr Rodney Langton   REFERRING DIAG: Lumbar spondylosis  Rationale for Evaluation and Treatment: Rehabilitation  THERAPY DIAG:  Lumbar spondylosis  Other low back pain  Muscle weakness (generalized)  ONSET DATE: 06/26/22 increased symptoms  SUBJECTIVE:                                                                                                                                                                                            SUBJECTIVE STATEMENT: Patient reports that her back is feeling okay. It is also time for her period yesterday and today. She is working on exercises. Using the noodle at work and working to avoid crossing ankles. Now has a TENS unit for home and has found a ball to use at home for ball release.   Eval: Patient reports that she has constant pain in the LB. She has had  pain for years. Symptoms have increased in the past year. X-rays show degenerative changes in the LB and hips R > L through the sacrum. She reports gradual onset of pain over the years.   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 Yes: NPRS scale: 6-7/10 Pain location: LB/sacral area  Pain description: burning; sharp; constant Aggravating factors: anything; increased activity such as housework or walking; standing in line Relieving factors: heat; ice; pain patch; rest  PRECAUTIONS: None   WEIGHT BEARING RESTRICTIONS: No  FALLS:  Has patient fallen in last 6 months? 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; has Programme researcher, broadcasting/film/video; working on property  PATIENT GOALS: figure out how to take some of the pain off  NEXT MD VISIT: 08/15/23  OBJECTIVE:   DIAGNOSTIC FINDINGS:  None available for spine   PATIENT SURVEYS:  FOTO 44; goal 58   SENSATION: WFL  MUSCLE LENGTH: Hamstrings: Right 85 deg; Left 80 deg Tight bilat hip flexors   POSTURE: rounded shoulders, forward head, and flexed trunk   PALPATION: Muscular tightness in the R > L hip flexors; piriformis; gluts Pain with spring testing through the sacrum; L5/4 area   LUMBAR ROM:   AROM eval  Flexion 90%  Extension 60%  Right lateral flexion 70% pain R side   Left  lateral flexion 75% mild pain L side   Right rotation 40%  Left rotation 45%   (Blank rows = not tested)  LOWER EXTREMITY ROM:   tight hip extension bilat    LOWER EXTREMITY MMT:  weak abdominals/core    MMT Right eval Right  08/14/23 Left eval Left  08/14/23  Hip flexion      Hip extension 4 4+ 4 4+  Hip abduction      Hip adduction      Hip internal rotation      Hip external rotation      Knee flexion      Knee extension      Ankle dorsiflexion      Ankle plantarflexion      Ankle inversion      Ankle eversion       (Blank rows = not tested)  LUMBAR SPECIAL TESTS:  Straight leg raise test: Negative and Slump test: Negative discomfort with slump test - no radicular pain   FUNCTIONAL TESTS:  5 times sit to stand:  GAIT: Distance walked: 40 Assistive device utilized: None Level of assistance: Complete Independence Comments: [employer]  [facility] Adult PT Treatment:                                                DATE: 08/14/23 Therapeutic Exercise:  Nustep L 7 x 5 min  Prone  Press up 2-3 sec x 8 Glut set 10 sec x 10  Supine  4 part core x 10 HEP Piriformis stretch 30 sec x 2 R/L HEP Hip abduction green TB alternating R/L 3 sec x 10  Sidelying  Hip abduction leading with heel 3 sec x 10 R/L  Sitting  Sit to stand hinged x 10 Standing  Mini wall squat 10 sec x 10  Counter pank 60 sec x  2  Row blue TB 3 sec x 10 Shoulder extension blue TB 3 sec x 10  Antirotation one strip blue TB 3 sec x 10 R/L  Ball release posterior hips  Manual Therapy:  Neuromuscular re-ed: Continue with postural correction  Therapeutic Activity: Sitting with noodle along spine  Modalities: TENS lower lumbar bilat in a line  Moist heat  Self Care: Discussed sitting posture and alignment - modifying chairs for home and work Importance of core strength   [facility] Adult PT Treatment:                                                DATE: 08/07/23 Therapeutic Exercise:  Nustep L 7 x 5 min  Prone   Press up 2-3 sec x 8 Glut set 10 sec x 10  Supine  4 part core x 10 HEP Piriformis stretch 30 sec x 2 R/L HEP Sitting  Sit to stand hinged hip x 10  Standing  Mini wall squat 10 sec x 10  Wall plank 60 sec x 1; Counter pank 60 sec x 2  Row blue TB 3 sec x 10 Shoulder extension blue TB 3 sec x 10  Antirotation one strip blue TB 3 sec x 10 R/L  Ball release posterior hips  Manual Therapy:  Neuromuscular re-ed: Continue with postural correction  Therapeutic Activity: Sitting with noodle along spine  Modalities: TENS lower lumbar bilat in a line  Moist heat  Self Care: Discussed sitting posture and alignment - modifying chairs for home and work Importance of core strength    [facility] Adult PT Treatment:      (dry needling)            DATE: 07/30/23 Therapeutic Exercise:  Nustep L 7 x 5 min  Prone  Press up 2-3 sec x 8 Supine  4 part core x 10 HEP Piriformis stretch 30 sec x 2 R/L HEP Sitting  Sit to stand hinged hip x 10  Standing  Mini wall squat 10 sec x 10  Counter pank 60 sec x 3  Row blue TB 3 sec x 10 Shoulder extension blue TB 3 sec x 10  Antirotation one strip blue TB 3 sec x 10 R/L  Ball release posterior hips  Manual Therapy: Skilled palpation to assess response to manual work and dry needling  Trigger Point Dry-Needling  Treatment instructions: Expect mild to moderate muscle soreness. S/S of pneumothorax if dry needled over a lung field, and to seek immediate medical attention should they occur. Patient verbalized understanding of these instructions and education.  Patient Consent Given: Yes Education handout provided: Yes Muscles treated: R/L lumbosacral; posterior hip at iliac crest - gluts  Electrical stimulation performed: Yes Parameters: mAmp current intensity to pt tolerance Treatment response/outcome: decreased palpable tightness   Neuromuscular re-ed: Continue with postural correction  Therapeutic Activity: Sitting with noodle along spine   Modalities: TENS lower lumbar bilat in a line  Moist heat  Self Care: Importance of core strength   PATIENT EDUCATION:  Education details: POC; HEP Person educated: Patient Education method: Explanation, Demonstration, Tactile cues, Verbal cues, and Handouts Education comprehension: verbalized understanding, returned demonstration, verbal cues required, tactile cues required, and needs further education  HOME EXERCISE PROGRAM:  Access Code: [facility] URL: [email or web address] Date: 08/14/2023 Prepared by: Corlis Leak  Exercises - Prone Press Up  -  2 x daily - 7 x weekly - 1 sets - 10 reps - 2-3 sec  hold - Standing Lumbar Extension  - 2 x daily - 7 x weekly - 1 sets - 2-3 reps - 2-3 sec  hold - Supine Transversus Abdominis Bracing with Pelvic Floor Contraction  - 2 x daily - 7 x weekly - 1 sets - 10 reps - 10sec  hold - Supine Piriformis Stretch with Leg Straight  - 2 x daily - 7 x weekly - 1 sets - 3 reps - 30 sec  hold - Standing Piriformis Release with Ball at [employer]  - 2 x daily - 7 x weekly - 30-60 sec  hold - Sit to Stand  - 2 x daily - 7 x weekly - 1 sets - 10 reps - 3-5 sec  hold - Wall Quarter Squat  - 2 x daily - 7 x weekly - 1-2 sets - 10 reps - 5-10 sec  hold - Plank on Counter  - 2 x daily - 7 x weekly - 1 sets - 3 reps - 30 sec  hold - Standing Bilateral Low Shoulder Row with Anchored Resistance  - 2 x daily - 7 x weekly - 1-3 sets - 10 reps - 2-3 sec  hold - Shoulder extension with resistance - Neutral  - 1 x daily - 7 x weekly - 1-2 sets - 10 reps - 3-5 sec  hold - Anti-Rotation Lateral Stepping with Press  - 2 x daily - 7 x weekly - 1-2 sets - 10 reps - 2-3 sec  hold - Supine Bridge  - 2 x daily - 7 x weekly - 1-2 sets - 10 reps - 5-10 sec  hold - Hooklying Isometric Clamshell  - 2 x daily - 7 x weekly - 1 sets - 10 reps - 3 sec  hold - Sidelying Hip Abduction  - 1 x daily - 7 x weekly - 3 sets - 10 reps - 3-5 sec  hold  Patient Education - Office  Posture - Trigger Point Dry Needling  ASSESSMENT:  CLINICAL IMPRESSION: Improvement in LBP with increased strength in bilat hips. Added strengthening for hip abductors for HEP. Continued with core stabilization. Reviewed and progressed exercises with focus on core stabilization exercises as tolerated with knee pain. Continue with TENS unit at home for pain management  OBJECTIVE IMPAIRMENTS: lumbar spondylosis; low back pain. She presents with poor core strength and stability; decreased trunk ROM/mobility; muscular tightness to palpation; constant pain; pain increased with functional activities decreased activity tolerance, decreased mobility, decreased ROM, decreased strength, increased fascial restrictions, impaired flexibility, improper body mechanics, postural dysfunction, and pain.    GOALS: Goals reviewed with patient? Yes  SHORT TERM GOALS: Target date: 08/27/2023   Independent in initial HEP  Baseline: Goal status: INITIAL  2.  Patient to demonstrate and/or verbalize improved posture and alignment for functional tasks  Baseline:  Goal status: INITIAL   LONG TERM GOALS: Target date: 10/08/2023   Decrease pain in LB and hips by 50-70% allowing patient to increase functional activity level without increased pain  Baseline:  Goal status: INITIAL  2.  Increase core strength and stability with patient to tolerate 30-40 minutes of strengthening exercises with minimal to no increase in pain  Baseline:  Goal status: INITIAL  3.  Patient reports increased tolerance for functional activities  Baseline:  Goal status: INITIAL  4.  5/5 bilat hip extension strength  Baseline:  Goal status: INITIAL  5.  Independent in HEP including aquatic exercise as indicated  Baseline:  Goal status: INITIAL  6.  Improve functional limitation score to 58  Baseline:  Goal status: INITIAL  PLAN:  PT FREQUENCY: 2x/week  PT DURATION: 12 weeks  PLANNED INTERVENTIONS: Therapeutic  exercises, Therapeutic activity, Neuromuscular re-education, Balance training, Gait training, Patient/Family education, Self Care, Joint mobilization, Aquatic Therapy, Dry Needling, Electrical stimulation, Spinal mobilization, Cryotherapy, Moist heat, Taping, Traction, Ultrasound, Manual therapy, and Re-evaluation.  PLAN FOR NEXT SESSION: review and progress exercises; continue with spine care education; manual work, DN, modalities as indicated    Naraly Fritcher Rober Minion, PT 08/14/2023, 8:09 AM

## 2023-08-15 ENCOUNTER — Ambulatory Visit: Payer: 59 | Admitting: Sports Medicine

## 2023-08-15 ENCOUNTER — Encounter: Payer: Self-pay | Admitting: Sports Medicine

## 2023-08-15 DIAGNOSIS — M47816 Spondylosis without myelopathy or radiculopathy, lumbar region: Secondary | ICD-10-CM | POA: Diagnosis not present

## 2023-08-15 NOTE — Assessment & Plan Note (Signed)
Multifactorial lumbar spondylitic changes, predominantly axial low back pain, she localized over the SI joint, right sided at the last visit, left-sided today, CT from 2022 showed L3-S1 facet arthritis as well as right worse than the left SI joint osteoarthritis. She has responded well to prednisone, increasing gabapentin to 300 nightly, formal physical therapy. She is complaining of some left-sided foot numbness more to the great toe, adding home herniated disc conditioning exercises, if this persists in spite of being consistent with the home physical therapy and gabapentin increases then we will get an MRI and target with an epidural.

## 2023-08-15 NOTE — Progress Notes (Signed)
    Procedures performed today:    None.  Independent interpretation of notes and tests performed by another provider:   None.  Brief History, Exam, Impression, and Recommendations:    Lumbar spondylosis Multifactorial lumbar spondylitic changes, predominantly axial low back pain, she localized over the SI joint, right sided at the last visit, left-sided today, CT from 2022 showed L3-S1 facet arthritis as well as right worse than the left SI joint osteoarthritis. She has responded well to prednisone, increasing gabapentin to 300 nightly, formal physical therapy. She is complaining of some left-sided foot numbness more to the great toe, adding home herniated disc conditioning exercises, if this persists in spite of being consistent with the home physical therapy and gabapentin increases then we will get an MRI and target with an epidural.    ____________________________________________ Ihor Austin. Benjamin Stain, M.D., ABFM., CAQSM., AME. Primary Care and Sports Medicine [facility]  Adjunct Professor of Family Medicine  [employer]  Restaurant manager, fast food

## 2023-08-21 ENCOUNTER — Ambulatory Visit: Payer: 59 | Admitting: Rehabilitative and Restorative Service Providers"

## 2023-10-14 DIAGNOSIS — K429 Umbilical hernia without obstruction or gangrene: Secondary | ICD-10-CM | POA: Diagnosis not present

## 2023-10-14 DIAGNOSIS — I728 Aneurysm of other specified arteries: Secondary | ICD-10-CM | POA: Diagnosis not present

## 2023-10-14 DIAGNOSIS — Q272 Other congenital malformations of renal artery: Secondary | ICD-10-CM | POA: Diagnosis not present

## 2023-10-14 DIAGNOSIS — Q2733 Arteriovenous malformation of digestive system vessel: Secondary | ICD-10-CM | POA: Diagnosis not present

## 2023-10-29 DIAGNOSIS — J029 Acute pharyngitis, unspecified: Secondary | ICD-10-CM | POA: Diagnosis not present

## 2023-11-06 DIAGNOSIS — R051 Acute cough: Secondary | ICD-10-CM | POA: Diagnosis not present

## 2023-11-06 DIAGNOSIS — B379 Candidiasis, unspecified: Secondary | ICD-10-CM | POA: Diagnosis not present

## 2023-11-06 DIAGNOSIS — Z6835 Body mass index (BMI) 35.0-35.9, adult: Secondary | ICD-10-CM | POA: Diagnosis not present

## 2023-11-06 DIAGNOSIS — J0100 Acute maxillary sinusitis, unspecified: Secondary | ICD-10-CM | POA: Diagnosis not present

## 2023-11-06 DIAGNOSIS — T3695XA Adverse effect of unspecified systemic antibiotic, initial encounter: Secondary | ICD-10-CM | POA: Diagnosis not present

## 2023-11-18 DIAGNOSIS — Z6835 Body mass index (BMI) 35.0-35.9, adult: Secondary | ICD-10-CM | POA: Diagnosis not present

## 2023-11-18 DIAGNOSIS — J0141 Acute recurrent pansinusitis: Secondary | ICD-10-CM | POA: Diagnosis not present

## 2023-12-12 DIAGNOSIS — E119 Type 2 diabetes mellitus without complications: Secondary | ICD-10-CM | POA: Diagnosis not present

## 2023-12-12 DIAGNOSIS — I10 Essential (primary) hypertension: Secondary | ICD-10-CM | POA: Diagnosis not present

## 2023-12-12 DIAGNOSIS — R4586 Emotional lability: Secondary | ICD-10-CM | POA: Diagnosis not present

## 2023-12-12 DIAGNOSIS — R232 Flushing: Secondary | ICD-10-CM | POA: Diagnosis not present

## 2023-12-12 DIAGNOSIS — F5101 Primary insomnia: Secondary | ICD-10-CM | POA: Diagnosis not present

## 2023-12-12 DIAGNOSIS — G8929 Other chronic pain: Secondary | ICD-10-CM | POA: Diagnosis not present

## 2023-12-12 DIAGNOSIS — M545 Low back pain, unspecified: Secondary | ICD-10-CM | POA: Diagnosis not present

## 2024-02-15 ENCOUNTER — Other Ambulatory Visit: Payer: Self-pay | Admitting: Sports Medicine

## 2024-02-15 DIAGNOSIS — M47816 Spondylosis without myelopathy or radiculopathy, lumbar region: Secondary | ICD-10-CM

## 2024-02-28 DIAGNOSIS — D6862 Lupus anticoagulant syndrome: Secondary | ICD-10-CM | POA: Diagnosis not present

## 2024-02-28 DIAGNOSIS — M6282 Rhabdomyolysis: Secondary | ICD-10-CM | POA: Diagnosis not present

## 2024-02-28 DIAGNOSIS — I10 Essential (primary) hypertension: Secondary | ICD-10-CM | POA: Diagnosis not present

## 2024-02-28 DIAGNOSIS — I2510 Atherosclerotic heart disease of native coronary artery without angina pectoris: Secondary | ICD-10-CM | POA: Diagnosis not present

## 2024-02-28 DIAGNOSIS — E789 Disorder of lipoprotein metabolism, unspecified: Secondary | ICD-10-CM | POA: Diagnosis not present

## 2024-02-28 DIAGNOSIS — I252 Old myocardial infarction: Secondary | ICD-10-CM | POA: Diagnosis not present

## 2024-02-28 DIAGNOSIS — T466X5A Adverse effect of antihyperlipidemic and antiarteriosclerotic drugs, initial encounter: Secondary | ICD-10-CM | POA: Diagnosis not present

## 2024-03-16 DIAGNOSIS — L03019 Cellulitis of unspecified finger: Secondary | ICD-10-CM | POA: Diagnosis not present

## 2024-03-16 DIAGNOSIS — Z6834 Body mass index (BMI) 34.0-34.9, adult: Secondary | ICD-10-CM | POA: Diagnosis not present

## 2024-03-16 DIAGNOSIS — I10 Essential (primary) hypertension: Secondary | ICD-10-CM | POA: Diagnosis not present

## 2024-07-28 ENCOUNTER — Encounter: Payer: Self-pay | Admitting: Sports Medicine

## 2024-08-24 DIAGNOSIS — T466X5A Adverse effect of antihyperlipidemic and antiarteriosclerotic drugs, initial encounter: Secondary | ICD-10-CM | POA: Diagnosis not present

## 2024-08-24 DIAGNOSIS — F4001 Agoraphobia with panic disorder: Secondary | ICD-10-CM | POA: Diagnosis not present

## 2024-08-24 DIAGNOSIS — E119 Type 2 diabetes mellitus without complications: Secondary | ICD-10-CM | POA: Diagnosis not present

## 2024-08-24 DIAGNOSIS — Z0000 Encounter for general adult medical examination without abnormal findings: Secondary | ICD-10-CM | POA: Diagnosis not present

## 2024-08-24 DIAGNOSIS — F5101 Primary insomnia: Secondary | ICD-10-CM | POA: Diagnosis not present

## 2024-08-24 DIAGNOSIS — M6282 Rhabdomyolysis: Secondary | ICD-10-CM | POA: Diagnosis not present

## 2024-08-24 DIAGNOSIS — I10 Essential (primary) hypertension: Secondary | ICD-10-CM | POA: Diagnosis not present

## 2024-08-24 DIAGNOSIS — K219 Gastro-esophageal reflux disease without esophagitis: Secondary | ICD-10-CM | POA: Diagnosis not present

## 2024-08-24 DIAGNOSIS — Z124 Encounter for screening for malignant neoplasm of cervix: Secondary | ICD-10-CM | POA: Diagnosis not present

## 2024-10-14 DIAGNOSIS — I10 Essential (primary) hypertension: Secondary | ICD-10-CM | POA: Diagnosis not present

## 2024-10-14 DIAGNOSIS — H60393 Other infective otitis externa, bilateral: Secondary | ICD-10-CM | POA: Diagnosis not present
